# Patient Record
Sex: Female | Born: 1950
Health system: Southern US, Community
[De-identification: ages and names within clinical notes are randomized; demographics above are authoritative.]

## PROBLEM LIST (undated history)

## (undated) DIAGNOSIS — C801 Malignant (primary) neoplasm, unspecified: Secondary | ICD-10-CM

## (undated) DIAGNOSIS — E7800 Pure hypercholesterolemia, unspecified: Secondary | ICD-10-CM

## (undated) DIAGNOSIS — Z8619 Personal history of other infectious and parasitic diseases: Secondary | ICD-10-CM

## (undated) DIAGNOSIS — D649 Anemia, unspecified: Secondary | ICD-10-CM

## (undated) DIAGNOSIS — N809 Endometriosis, unspecified: Secondary | ICD-10-CM

## (undated) DIAGNOSIS — D219 Benign neoplasm of connective and other soft tissue, unspecified: Secondary | ICD-10-CM

## (undated) DIAGNOSIS — F329 Major depressive disorder, single episode, unspecified: Secondary | ICD-10-CM

## (undated) DIAGNOSIS — F32A Depression, unspecified: Secondary | ICD-10-CM

## (undated) DIAGNOSIS — T7840XA Allergy, unspecified, initial encounter: Secondary | ICD-10-CM

## (undated) HISTORY — PX: APPENDECTOMY: SHX54

## (undated) HISTORY — PX: BUNIONECTOMY: SHX129

## (undated) HISTORY — DX: Endometriosis, unspecified: N80.9

## (undated) HISTORY — DX: Malignant (primary) neoplasm, unspecified: C80.1

## (undated) HISTORY — PX: TUBAL LIGATION: SHX77

## (undated) HISTORY — DX: Personal history of other infectious and parasitic diseases: Z86.19

## (undated) HISTORY — DX: Major depressive disorder, single episode, unspecified: F32.9

## (undated) HISTORY — DX: Anemia, unspecified: D64.9

## (undated) HISTORY — PX: COSMETIC SURGERY: SHX468

## (undated) HISTORY — PX: COLONOSCOPY: SHX174

## (undated) HISTORY — PX: BREAST EXCISIONAL BIOPSY: SUR124

## (undated) HISTORY — DX: Benign neoplasm of connective and other soft tissue, unspecified: D21.9

## (undated) HISTORY — DX: Allergy, unspecified, initial encounter: T78.40XA

## (undated) HISTORY — PX: ABDOMINAL HYSTERECTOMY: SHX81

## (undated) HISTORY — DX: Depression, unspecified: F32.A

---

## 2004-04-10 HISTORY — PX: ATRIAL TACH ABLATION: EP1192

## 2015-04-11 HISTORY — PX: ROTATOR CUFF REPAIR: SHX139

## 2015-10-25 ENCOUNTER — Ambulatory Visit (INDEPENDENT_AMBULATORY_CARE_PROVIDER_SITE_OTHER): Payer: BLUE CROSS/BLUE SHIELD | Admitting: Podiatry

## 2015-10-25 ENCOUNTER — Ambulatory Visit (INDEPENDENT_AMBULATORY_CARE_PROVIDER_SITE_OTHER): Payer: BLUE CROSS/BLUE SHIELD

## 2015-10-25 ENCOUNTER — Encounter: Payer: Self-pay | Admitting: Podiatry

## 2015-10-25 VITALS — BP 107/56 | HR 70 | Resp 18

## 2015-10-25 DIAGNOSIS — B351 Tinea unguium: Secondary | ICD-10-CM

## 2015-10-25 DIAGNOSIS — M2011 Hallux valgus (acquired), right foot: Secondary | ICD-10-CM

## 2015-10-25 DIAGNOSIS — R52 Pain, unspecified: Secondary | ICD-10-CM

## 2015-10-25 DIAGNOSIS — M21621 Bunionette of right foot: Secondary | ICD-10-CM

## 2015-10-25 NOTE — Patient Instructions (Signed)

## 2015-10-25 NOTE — Progress Notes (Signed)
Subjective:    Patient ID: Sheila Hawkins, female    DOB: [birth year], 65 y.o.   MRN: [id number]  HPI  65 year old female presents the also concerns of her right foot bunion as well as tailor's bunion which is been ongoing for several years. She presents today had left foot bunion surgery in 2009 as well as a stress fracture of the second metatarsal she had fixed. She states that the right foot bunion Sellars bunion is painful with shoe gear and pressure. She has tried padding, offloading as well as changing her shoes but any relief of symptoms. At this time should discuss surgical intervention.She also states that both of her second toenails are thick and discolored but not causing any pain. Denies any redness or drainage.   Review of Systems  All other systems reviewed and are negative.      Objective:   Physical Exam General: AAO x3, NAD  Dermatological: Skin is warm, dry and supple bilateral. That'll second digit has are dystrophic, discolored hypertrophic without any tenderness or signs of infection. There are no open sores, no preulcerative lesions, no rash or signs of infection present.  Vascular: Dorsalis Pedis artery and Posterior Tibial artery pedal pulses are 2/4 bilateral with immedate capillary fill time. Pedal hair growth present. There is no pain with calf compression, swelling, warmth, erythema.   Neruologic: Grossly intact via light touch bilateral. Vibratory intact via tuning fork bilateral. Protective threshold with Semmes Wienstein monofilament intact to all pedal sites bilateral.   Musculoskeletal: HAV is present on the right side with erythema on the medial aspect of the first metatarsal head from shoe gear. There is no pain or crepitation with first MPJ range of motion. There is no hypermobility present. Tailors bunion is evident with again irritation on the lateral aspect of the fifth metatarsal head from irritation in shoes. No skin breakdown. There is no pain or  crepitation fifth MPJ range of motion. Evidence of previous surgery of the left bunion with a healed scar. No other areas of tenderness bilaterally. MMT 5/5. Range of motion intact.  Gait: Unassisted, Nonantalgic.       Assessment & Plan:  65 year old female synthetic right foot tailor's bunion, HAV -Treatment options discussed including all alternatives, risks, and complications -Etiology of symptoms were discussed -X-rays were obtained and reviewed with the patient. Tailors bunion HAV present. No evidence of acute fracture. -I discussed both conservative and surgical treatment options. This points to the tendon multiple conservative treatment options without much relief of symptoms. She is requesting surgical intervention at this time. I discussed with her also bunionectomy and tailor's bunionectomy with screw fixation. She wishes to proceed with surgical intervention. -The incision placement as well as the postoperative course was discussed with the patient. I discussed risks of the surgery which include, but not limited to, infection, bleeding, pain, swelling, need for further surgery, delayed or nonhealing, painful or ugly scar, numbness or sensation changes, over/under correction, recurrence, transfer lesions, further deformity, hardware failure, DVT/PE, loss of toe/foot. Patient understands these risks and wishes to proceed with surgery. The surgical consent was reviewed with the patient all 3 pages were signed. No promises or guarantees were given to the outcome of the procedure. All questions were answered to the best of my ability. Before the surgery the patient was encouraged to call the office if there is any further questions. The surgery will be performed at the [facility] on an outpatient basis. -Bilateral second digit nails were debrided and sent for  culture. This was sent to [facility].   Celesta Gentile, DPM

## 2015-11-04 ENCOUNTER — Telehealth: Payer: Self-pay | Admitting: *Deleted

## 2015-11-04 NOTE — Telephone Encounter (Signed)
Dr. Jacqualyn Posey reviewed fungal culture results of 10/25/2015 as +, request pt make an appt.  I informed pt of results and orders and transferred to schedulers.

## 2015-11-08 ENCOUNTER — Encounter: Payer: Self-pay | Admitting: Podiatry

## 2015-11-08 ENCOUNTER — Ambulatory Visit (INDEPENDENT_AMBULATORY_CARE_PROVIDER_SITE_OTHER): Payer: BLUE CROSS/BLUE SHIELD | Admitting: Podiatry

## 2015-11-08 DIAGNOSIS — B351 Tinea unguium: Secondary | ICD-10-CM

## 2015-11-08 MED ORDER — TERBINAFINE HCL 250 MG PO TABS: 250.0000 mg | ORAL_TABLET | Freq: Every day | ORAL | 0 refills | Status: DC

## 2015-11-08 NOTE — Progress Notes (Signed)
Subjective: Patient presents today for nail culture results. She denies any changes since last appointment and no new concerns today. She is scheduled for right foot surgery in September she has no further questions about this. She does continue to get pain over the bunion sites. Denies any systemic complaints such as fevers, chills, nausea, vomiting. No acute changes since last appointment, and no other complaints at this time.   Objective: AAO x3, NAD DP/PT pulses palpable bilaterally, CRT less than 3 seconds Nails are somewhat dystrophic, discolored hypertrophic particularly left second digit in the right third digit toenail. No pain of the tenderness andrednessordrainage. No edema, erythema, increase in warmth to bilateral lower extremities.  No open lesions or pre-ulcerative lesions.  No pain with calf compression, swelling, warmth, erythema  Assessment: Onychomycosis  Plan: -All treatment options discussed with the patient including all alternatives, risks, complications.  -Nail culture results were discussed with the patient. This time she has elected to proceed with oral Lamisil after discussing risks, complications and side effects the medication. She wishes to proceed. Ordered blood work today as well as the medicine. Do not start the medicine call the results the blood work and she understood this. Follow-up in 4-6 weeks. -Patient encouraged to call the office with any questions, concerns, change in symptoms.   Celesta Gentile, DPM

## 2015-11-08 NOTE — Patient Instructions (Signed)

## 2015-11-09 LAB — HEPATIC FUNCTION PANEL
ALT: 11 U/L (ref 6–29)
AST: 18 U/L (ref 10–35)
Albumin: 4.1 g/dL (ref 3.6–5.1)
Alkaline Phosphatase: 67 U/L (ref 33–130)
Bilirubin, Direct: 0.1 mg/dL (ref <=0.2)
Indirect Bilirubin: 0.3 mg/dL (ref 0.2–1.2)
Total Bilirubin: 0.4 mg/dL (ref 0.2–1.2)
Total Protein: 6 g/dL — ABNORMAL LOW (ref 6.1–8.1)

## 2015-12-10 ENCOUNTER — Ambulatory Visit (INDEPENDENT_AMBULATORY_CARE_PROVIDER_SITE_OTHER): Payer: BLUE CROSS/BLUE SHIELD | Admitting: Podiatry

## 2015-12-10 DIAGNOSIS — B351 Tinea unguium: Secondary | ICD-10-CM | POA: Diagnosis not present

## 2015-12-10 DIAGNOSIS — M21621 Bunionette of right foot: Secondary | ICD-10-CM | POA: Diagnosis not present

## 2015-12-10 DIAGNOSIS — M2011 Hallux valgus (acquired), right foot: Secondary | ICD-10-CM

## 2015-12-10 MED ORDER — TERBINAFINE HCL 250 MG PO TABS: 250.0000 mg | ORAL_TABLET | Freq: Every day | ORAL | 0 refills | Status: DC

## 2015-12-10 MED ORDER — CEPHALEXIN 500 MG PO CAPS: 500.0000 mg | ORAL_CAPSULE | Freq: Three times a day (TID) | ORAL | 1 refills | Status: DC

## 2015-12-10 MED ORDER — MEPERIDINE HCL 50 MG PO TABS: 50.0000 mg | ORAL_TABLET | ORAL | 0 refills | Status: DC | PRN

## 2015-12-10 MED ORDER — PROMETHAZINE HCL 25 MG PO TABS: 25.0000 mg | ORAL_TABLET | Freq: Three times a day (TID) | ORAL | 0 refills | Status: DC | PRN

## 2015-12-10 NOTE — Patient Instructions (Signed)
You can go ahead and start the lamisil Hold off on taking demerol, phenergan, keflex as these are your post-operative medications. You can start these after surgery  If you have any questions before surgery, please give me a call or I will see you Wednesday morning before surgery.

## 2015-12-15 ENCOUNTER — Encounter: Payer: Self-pay | Admitting: Podiatry

## 2015-12-15 DIAGNOSIS — M2011 Hallux valgus (acquired), right foot: Secondary | ICD-10-CM | POA: Diagnosis not present

## 2015-12-17 NOTE — Progress Notes (Signed)
DOS 09.06.2017 Right foot surgical repair of bunion and taylors bunion with screw fixation

## 2015-12-19 DIAGNOSIS — M2010 Hallux valgus (acquired), unspecified foot: Secondary | ICD-10-CM | POA: Insufficient documentation

## 2015-12-19 DIAGNOSIS — M21621 Bunionette of right foot: Secondary | ICD-10-CM | POA: Insufficient documentation

## 2015-12-19 NOTE — Progress Notes (Signed)
Subjective: Patient presents today to follow-up after starting lamisil however she states that she never got the prescription. [place] and did not want to start. She has no new concerns today. She is also scheduled for bunion/tailors bunion surgery next week and she states that she is ready and has no questions today.  Denies any systemic complaints such as fevers, chills, nausea, vomiting. No acute changes since last appointment, and no other complaints at this time.   Objective: AAO x3, NAD DP/PT pulses palpable bilaterally, CRT less than 3 seconds Nails are unchanged and are dystrophic, discolored and hypertrophic mostly the left 2nd and right 3rd digit. No pain to the nails and no drainage, redness or signs of infection.  Moderate HAV and tailors bunion which is painful. Overall unchanged.  No open lesions or pre-ulcerative lesions.  No pain with calf compression, swelling, warmth, erythema  Assessment: Onychomycosis, HAV/Tailors bunion  Plan: -All treatment options discussed with the patient including all alternatives, risks, complications.  -Lamsil prescribed and again discussed potential side effects of the medication. She understands -Again discussed the upcoming surgery. She has no further questions. Post-operative medications prescribed today.  -Follow-up after surgery -Patient encouraged to call the office with any questions, concerns, change in symptoms.   Celesta Gentile, DPM

## 2015-12-20 ENCOUNTER — Ambulatory Visit (INDEPENDENT_AMBULATORY_CARE_PROVIDER_SITE_OTHER): Payer: BLUE CROSS/BLUE SHIELD

## 2015-12-20 ENCOUNTER — Ambulatory Visit (INDEPENDENT_AMBULATORY_CARE_PROVIDER_SITE_OTHER): Payer: BLUE CROSS/BLUE SHIELD | Admitting: Podiatry

## 2015-12-20 ENCOUNTER — Encounter: Payer: Self-pay | Admitting: Podiatry

## 2015-12-20 DIAGNOSIS — Z09 Encounter for follow-up examination after completed treatment for conditions other than malignant neoplasm: Secondary | ICD-10-CM

## 2015-12-20 DIAGNOSIS — M21621 Bunionette of right foot: Secondary | ICD-10-CM

## 2015-12-20 DIAGNOSIS — M216X1 Other acquired deformities of right foot: Secondary | ICD-10-CM

## 2015-12-20 DIAGNOSIS — M2011 Hallux valgus (acquired), right foot: Secondary | ICD-10-CM

## 2015-12-21 NOTE — Progress Notes (Signed)
Subjective: Sheila Hawkins is a 65 y.o. is seen today in office s/p right foot Austin bunionectomy and tailors bunion preformed on 12/15/15 . They state their pain is minimal and she has not received any pain medication since Saturday. She has continued to wear the CAM boot but does not wear it at night. Denies any systemic complaints such as fevers, chills, nausea, vomiting. No calf pain, chest pain, shortness of breath.   Objective: General: No acute distress, AAOx3  DP/PT pulses palpable 2/4, CRT < 3 sec to all digits.  Protective sensation intact. Motor function intact.  Right foot: Incision is well coapted without any evidence of dehiscence. There is no surrounding erythema, ascending cellulitis, fluctuance, crepitus, malodor, drainage/purulence. There is mild edema around the surgical site. There is minimal pain along the surgical site.  No other areas of tenderness to bilateral lower extremities.  No other open lesions or pre-ulcerative lesions.  No pain with calf compression, swelling, warmth, erythema.   Assessment and Plan:  Status post right foot surgery, doing well with no complications   -Treatment options discussed including all alternatives, risks, and complications -X-rays were obtained and reviewed with the patient. Hardware intact. No evidence of acute fracture.  -Antibiotic ointment applied followed by a bandage. Keep bandage clean, dry, intact. -Continue cam boot at all times. -Ice/elevation -Pain medication as needed. -Finish course of antibiotics  -Monitor for any clinical signs or symptoms of infection and DVT/PE and directed to call the office immediately should any occur or go to the ER. -Follow-up in 1 week or sooner if any problems arise. In the meantime, encouraged to call the office with any questions, concerns, change in symptoms.   Celesta Gentile, DPM

## 2015-12-27 ENCOUNTER — Ambulatory Visit (INDEPENDENT_AMBULATORY_CARE_PROVIDER_SITE_OTHER): Payer: BLUE CROSS/BLUE SHIELD | Admitting: Podiatry

## 2015-12-27 DIAGNOSIS — M2011 Hallux valgus (acquired), right foot: Secondary | ICD-10-CM

## 2015-12-27 DIAGNOSIS — M21621 Bunionette of right foot: Secondary | ICD-10-CM

## 2015-12-27 DIAGNOSIS — Z09 Encounter for follow-up examination after completed treatment for conditions other than malignant neoplasm: Secondary | ICD-10-CM

## 2016-01-03 NOTE — Progress Notes (Signed)
Subjective: Sheila Hawkins is a 65 y.o. is seen today in office s/p right foot Austin bunionectomy and tailors bunion preformed on 12/15/15. She states that she is doing well she's not having any pain. She is continued with his CAM boot but she does not sleep in this at night. She has been trying to move her toes to increase the range of motion to her toes and she states that she is doing well. Denies any systemic complaints such as fevers, chills, nausea, vomiting. No calf pain, chest pain, shortness of breath.   Objective: General: No acute distress, AAOx3  DP/PT pulses palpable 2/4, CRT < 3 sec to all digits.  Protective sensation intact. Motor function intact.  Right foot: Incision is well coapted without any evidence of dehiscence and suture ends are intact. There is no surrounding erythema, ascending cellulitis, fluctuance, crepitus, malodor, drainage/purulence. There is decreased edema around the surgical site. There is no significant pain along the surgical site.  No other areas of tenderness to bilateral lower extremities.  No other open lesions or pre-ulcerative lesions.  No pain with calf compression, swelling, warmth, erythema.   Assessment and Plan:  Status post right foot surgery, doing well with no complications   -Treatment options discussed including all alternatives, risks, and complications -Suture ends were cut today. About, was applied followed by a bandage. She can start to shower tomorrow. Recommended an about ointment dressing changes daily. -Continue with CAM boot at all times -Gentle range of motion exercises for the first MPJ -Ice/elevation -Pain medication as needed. -Monitor for any clinical signs or symptoms of infection and DVT/PE and directed to call the office immediately should any occur or go to the ER. -Follow-up as scheduled or sooner if any problems arise. In the meantime, encouraged to call the office with any questions, concerns, change in symptoms.   Celesta Gentile, DPM

## 2016-01-10 ENCOUNTER — Ambulatory Visit (INDEPENDENT_AMBULATORY_CARE_PROVIDER_SITE_OTHER): Payer: BLUE CROSS/BLUE SHIELD | Admitting: Podiatry

## 2016-01-10 ENCOUNTER — Ambulatory Visit (INDEPENDENT_AMBULATORY_CARE_PROVIDER_SITE_OTHER): Payer: BLUE CROSS/BLUE SHIELD

## 2016-01-10 DIAGNOSIS — M21621 Bunionette of right foot: Secondary | ICD-10-CM

## 2016-01-10 DIAGNOSIS — Z09 Encounter for follow-up examination after completed treatment for conditions other than malignant neoplasm: Secondary | ICD-10-CM | POA: Diagnosis not present

## 2016-01-10 DIAGNOSIS — M2011 Hallux valgus (acquired), right foot: Secondary | ICD-10-CM

## 2016-01-14 ENCOUNTER — Encounter: Payer: BLUE CROSS/BLUE SHIELD | Admitting: Podiatry

## 2016-01-20 NOTE — Progress Notes (Signed)
Subjective: Sheila Hawkins is a 65 y.o. is seen today in office s/p right foot Austin bunionectomy and tailors bunion preformed on 12/15/15. She scan some occasional burning pain along the bunion site which is new. Overall her pain has improved. She is also concerned of the fifth toe asserted drift in warts and this may be due to the compression stocking to typing pressure to the area. Denies any pain to the area. Denies any systemic complaints such as fevers, chills, nausea, vomiting. No calf pain, chest pain, shortness of breath.   Objective: General: No acute distress, AAOx3  DP/PT pulses palpable 2/4, CRT < 3 sec to all digits.  Protective sensation intact. Motor function intact.  Right foot: Incisions are well coapted without any evidence of dehiscence and scar has formed There is no surrounding erythema, ascending cellulitis, fluctuance, crepitus, malodor, drainage/purulence. There is decreased edema around the surgical site. There is no significant pain along the surgical site. There is good range of motion of the MPJ. There is no restriction identified. No other areas of tenderness to bilateral lower extremities.  No other open lesions or pre-ulcerative lesions.  No pain with calf compression, swelling, warmth, erythema.   Assessment and Plan:  Status post right foot surgery, doing well with no complications   -Treatment options discussed including all alternatives, risks, and complications -X-rays were reviewed. Hardware intact. Increased consolidation across the osteotomy sites. -She decided to transition to a surgical shoe. Continue weightbearing as tolerated. Continue range of motion exercises. -Hold off on the compression sock is is maybe, the toe to drift as there does appear to be a slight increase and adductovarus the fifth toe. Toe separator was also dispensed today. -Continue ice and elevation -Pain medication as needed. -Monitor for signs or symptoms of infection to the ER should  any occur call the office. -Follow as scheduled or sooner if needed.  Celesta Gentile, DPM

## 2016-01-24 ENCOUNTER — Ambulatory Visit (INDEPENDENT_AMBULATORY_CARE_PROVIDER_SITE_OTHER): Payer: BLUE CROSS/BLUE SHIELD

## 2016-01-24 ENCOUNTER — Ambulatory Visit (INDEPENDENT_AMBULATORY_CARE_PROVIDER_SITE_OTHER): Payer: BLUE CROSS/BLUE SHIELD | Admitting: Podiatry

## 2016-01-24 ENCOUNTER — Encounter: Payer: Self-pay | Admitting: Podiatry

## 2016-01-24 VITALS — BP 113/64 | HR 81 | Resp 16

## 2016-01-24 DIAGNOSIS — M2011 Hallux valgus (acquired), right foot: Secondary | ICD-10-CM

## 2016-01-24 DIAGNOSIS — Z9889 Other specified postprocedural states: Secondary | ICD-10-CM

## 2016-01-24 DIAGNOSIS — Z79899 Other long term (current) drug therapy: Secondary | ICD-10-CM

## 2016-01-24 MED ORDER — NONFORMULARY OR COMPOUNDED ITEM: 2 refills | Status: DC

## 2016-01-24 NOTE — Progress Notes (Signed)
Subjective: Sheila Hawkins is a 65 y.o. is seen today in office s/p right foot Austin bunionectomy and tailors bunion preformed on 12/15/15. She states that she has good range of motion but she has some nerve pain to there surgical site. He points along the incision and she states that this is very sensitive to the touch. She's been using essential oils over the area when it becomes very sensitive. She is ready to get back to walking and wear a regular shoe. She has been using Lamisil daily. Denies any side effects. Denies any systemic complaints such as fevers, chills, nausea, vomiting. No calf pain, chest pain, shortness of breath.   Objective: General: No acute distress, AAOx3  DP/PT pulses palpable 2/4, CRT < 3 sec to all digits.  Protective sensation intact. Motor function intact.  Right foot: Incisions are well coapted without any evidence of dehiscence and scar has formed There is no surrounding erythema, ascending cellulitis, fluctuance, crepitus, malodor, drainage/purulence. There is decreased edema around the surgical site. There is no significant pain along the surgical site. There is good range of motion of the MPJ. There is no restriction with range of motion. Subjectively there is decreased sensation along the medial aspect on the bunion site and there is sensitivity on the incision. No other areas of tenderness to bilateral lower extremities.  No other open lesions or pre-ulcerative lesions. Nails are fairly growing out along the proximal nail border. there is no tenderness the toenails.  No pain with calf compression, swelling, warmth, erythema.   Assessment and Plan:  Status post right foot surgery, with neuritis type symptoms; onychomycosis  -Treatment options discussed including all alternatives, risks, and complications -X-rays were reviewed. Hardware intact. Increased consolidation across the osteotomy sites. -Order compound cream to help with nerve pain. If symptoms continue  discussed gabapentin. -Continue range of motion exercises -Can transition to regular shoe as tolerated and gradually increase her activity. -Ice elevation -Continue Lamisil. She's having no side effects. Repeat blood work today. Finish 3 month course of Lamisil. -Follow-up in 4 weeks or sooner if needed. In the meantime call any questions or concerns or any changes symptoms.  Celesta Gentile, DPM  *scan for orthotics next appointment

## 2016-01-25 LAB — CBC WITH DIFFERENTIAL/PLATELET
Basophils Absolute: 84 cells/uL (ref 0–200)
Basophils Relative: 1 %
Eosinophils Absolute: 84 cells/uL (ref 15–500)
Eosinophils Relative: 1 %
HCT: 40.9 % (ref 35.0–45.0)
Hemoglobin: 13.2 g/dL (ref 11.7–15.5)
Lymphocytes Relative: 22 %
Lymphs Abs: 1848 cells/uL (ref 850–3900)
MCH: 29.7 pg (ref 27.0–33.0)
MCHC: 32.3 g/dL (ref 32.0–36.0)
MCV: 91.9 fL (ref 80.0–100.0)
MPV: 9.7 fL (ref 7.5–12.5)
Monocytes Absolute: 588 cells/uL (ref 200–950)
Monocytes Relative: 7 %
Neutro Abs: 5796 cells/uL (ref 1500–7800)
Neutrophils Relative %: 69 %
Platelets: 272 10*3/uL (ref 140–400)
RBC: 4.45 MIL/uL (ref 3.80–5.10)
RDW: 13.8 % (ref 11.0–15.0)
WBC: 8.4 10*3/uL (ref 3.8–10.8)

## 2016-01-26 ENCOUNTER — Telehealth: Payer: Self-pay | Admitting: *Deleted

## 2016-01-26 LAB — HEPATIC FUNCTION PANEL
ALT: 8 U/L (ref 6–29)
AST: 15 U/L (ref 10–35)
Albumin: 3.9 g/dL (ref 3.6–5.1)
Alkaline Phosphatase: 67 U/L (ref 33–130)
Bilirubin, Direct: 0.1 mg/dL (ref <=0.2)
Indirect Bilirubin: 0.2 mg/dL (ref 0.2–1.2)
Total Bilirubin: 0.3 mg/dL (ref 0.2–1.2)
Total Protein: 6.3 g/dL (ref 6.1–8.1)

## 2016-01-26 NOTE — Telephone Encounter (Addendum)
Message from Sheila Hawkins, DPM sent at 01/26/2016 10:09 AM EDT ----- Blood work normal- continue lamisil. Please let her know. Thank you. Informed pt of Dr. Leigh Aurora orders.

## 2016-02-21 ENCOUNTER — Ambulatory Visit (INDEPENDENT_AMBULATORY_CARE_PROVIDER_SITE_OTHER): Payer: BLUE CROSS/BLUE SHIELD | Admitting: Podiatry

## 2016-02-21 ENCOUNTER — Ambulatory Visit (INDEPENDENT_AMBULATORY_CARE_PROVIDER_SITE_OTHER): Payer: BLUE CROSS/BLUE SHIELD

## 2016-02-21 DIAGNOSIS — Z9889 Other specified postprocedural states: Secondary | ICD-10-CM

## 2016-02-21 DIAGNOSIS — Z09 Encounter for follow-up examination after completed treatment for conditions other than malignant neoplasm: Secondary | ICD-10-CM

## 2016-02-21 DIAGNOSIS — M21621 Bunionette of right foot: Secondary | ICD-10-CM | POA: Diagnosis not present

## 2016-02-21 DIAGNOSIS — M722 Plantar fascial fibromatosis: Secondary | ICD-10-CM

## 2016-02-21 DIAGNOSIS — M79673 Pain in unspecified foot: Secondary | ICD-10-CM

## 2016-02-21 DIAGNOSIS — M2011 Hallux valgus (acquired), right foot: Secondary | ICD-10-CM

## 2016-02-21 MED ORDER — GABAPENTIN 100 MG PO CAPS: 100.0000 mg | ORAL_CAPSULE | Freq: Every day | ORAL | 3 refills | Status: DC

## 2016-02-25 NOTE — Progress Notes (Signed)
Subjective: Sheila Hawkins is a 65 y.o. is seen today in office s/p right foot Austin bunionectomy and tailors bunion preformed on 12/15/15. She states that she is up to walking 2 miles. She states that she doesn't have significant pain to the surgical site starting the day however at night she still gets sharp pain and she is concerned about the nerve   damage. She's been using the compound cream without any significant relief and she is also using the essential oils. She states that she has very good range of motion to her big toe joint when she has no problems during the day but again denies where she is majority of symptoms. She is also been rowing without any significant problems.  She is still taking the Lamisil for onychomycosis and she is almost completed his course. She states the nails have grown out. Denies any pain of the toenails or any redness or drainage.  Denies any systemic complaints such as fevers, chills, nausea, vomiting. No calf pain, chest pain, shortness of breath.   Objective: General: No acute distress, AAOx3  DP/PT pulses palpable 2/4, CRT < 3 sec to all digits.  Protective sensation intact. Motor function intact.  Right foot: Incisions are well coapted without any evidence of dehiscence and scar has formed. There is very good range of motion the first metatarsal phalangeal joint there is no tenderness to palpation on the surgical sites. Hallux sits in a rectus position. There is minimal edema to the area without any surrounding erythema or increase in warmth. She points on the medial aspect on the bunion site where she still gets some nerve pain. She gets tenderness on the medial aspect of the midfoot bilaterally this is been ongoing since prior to the surgery. She has an old pair of orthotics but she feels that she is a new pair. She also gets some subjective tenderness along the medial band plantar fascia the arch of the foot however she is otherwise. The pain today. No other  areas of tenderness bilaterally. Nails appear to be improving there is clearing on the proximal nail border. There is no tenderness there is no surrounding redness or drainage. No pain with calf compression, swelling, warmth, erythema.   Assessment and Plan:  Status post right foot surgery, with neuritis type symptoms; onychomycosis  -Treatment options discussed including all alternatives, risks, and complications -X-rays were reviewed. Increased consolidation across the osteotomy sites. hardware intact. No evidence of acute fracture. -At this point the nerve pain will start gabapentin. Start 100 mg at nighttime if she can tolerate the medicine. She can still use the compound cream as well. -Consider steroid injection to the area next appointment  -Continue supportive shoegear. Also discussed orthotics and she would like to proceed with this as well. She was scanned for orthotics may be sent to [facility] labs. -Follow-up in 3 weeks or sooner if needed. Call any questions or concerns in the meantime.  Celesta Gentile, DPM

## 2016-03-13 ENCOUNTER — Ambulatory Visit: Payer: BLUE CROSS/BLUE SHIELD | Admitting: Podiatry

## 2016-03-24 ENCOUNTER — Ambulatory Visit (INDEPENDENT_AMBULATORY_CARE_PROVIDER_SITE_OTHER): Payer: BLUE CROSS/BLUE SHIELD | Admitting: Podiatry

## 2016-03-24 ENCOUNTER — Encounter: Payer: Self-pay | Admitting: Podiatry

## 2016-03-24 DIAGNOSIS — S90222A Contusion of left lesser toe(s) with damage to nail, initial encounter: Secondary | ICD-10-CM

## 2016-03-24 DIAGNOSIS — B351 Tinea unguium: Secondary | ICD-10-CM | POA: Diagnosis not present

## 2016-03-24 DIAGNOSIS — M792 Neuralgia and neuritis, unspecified: Secondary | ICD-10-CM

## 2016-03-24 DIAGNOSIS — M2011 Hallux valgus (acquired), right foot: Secondary | ICD-10-CM

## 2016-03-24 DIAGNOSIS — M21621 Bunionette of right foot: Secondary | ICD-10-CM

## 2016-03-24 NOTE — Patient Instructions (Signed)

## 2016-03-27 MED ORDER — NONFORMULARY OR COMPOUNDED ITEM: 2 refills | Status: DC

## 2016-03-27 NOTE — Progress Notes (Signed)
Subjective: 65 year old female presents the office they for follow-up evaluation of the right is status post bunionectomy. She also presents a pickup orthotics. She states that she still gets some sharp intermittent discomfort at times. It is somewhat improved but does continue. She is taking gabapentin 100 mg at night time which is helping submit she stopped it within the pain started to come back. She has been able to walk and do activities without any significant pain. She has also noticed a black spot forming her left fourth toenail. She trimmed this about 2 weeks ago but a black spot out. She is completed her course of Lamisil. Denies any pain or swelling or any drainage. Denies any systemic complaints such as fevers, chills, nausea, vomiting. No acute changes since last appointment, and no other complaints at this time.   Objective: AAO x3, NAD DP/PT pulses palpable bilaterally, CRT less than 3 seconds Scars from prior surgery well healed. There is good range of motion the first MTPJ. There continues been neuritis symptoms along the medial aspect of the foot along the bunion site. There is no specific area pinpoint bony tenderness pain vibratory sensations today. Mild discomfort on the medial band plantar fasciitis the foot. Nails can be somewhat dystrophic and discolored. There is no pain to the toenails there is no surrounding redness or drainage. Left fourth toenail the very distal is a very small area of black discoloration. Upon debridement as area was removed and appeared to be a small amount of dried blood. There is no hyperpigmentation into the skin or to the other areas of the nail. No open lesions or pre-ulcerative lesions.  No pain with calf compression, swelling, warmth, erythema  Assessment: Neuritis status post when he has been right foot, onychomycosis, subungual hematoma  Plan: -All treatment options discussed with the patient including all alternatives, risks, complications.   -Given her continued gabapentin. Also discussed topical creams for this as well. Offloading pads. -Order compound cream for onychomycosis. She completed her course of Lamisil. There is some improvement in the nails but they still remain discolored and thickened mostly towards the distal portion the nail. -I debrided the left fourth digit toenails remove a small subdural hematoma. There is no remaining hyperpigmentation. Continue to monitor. -Orthotics are dispensed today. Oral and written break in instructions were discussed. -Follow up in 4 weeks or sooner if needed. -Patient encouraged to call the office with any questions, concerns, change in symptoms.   Celesta Gentile, DPM

## 2016-04-21 ENCOUNTER — Ambulatory Visit: Payer: BLUE CROSS/BLUE SHIELD

## 2016-04-21 ENCOUNTER — Ambulatory Visit (INDEPENDENT_AMBULATORY_CARE_PROVIDER_SITE_OTHER): Payer: BLUE CROSS/BLUE SHIELD | Admitting: Podiatry

## 2016-04-21 ENCOUNTER — Encounter: Payer: Self-pay | Admitting: Podiatry

## 2016-04-21 DIAGNOSIS — R52 Pain, unspecified: Secondary | ICD-10-CM

## 2016-04-21 DIAGNOSIS — M21611 Bunion of right foot: Secondary | ICD-10-CM

## 2016-04-21 DIAGNOSIS — M792 Neuralgia and neuritis, unspecified: Secondary | ICD-10-CM

## 2016-04-25 NOTE — Progress Notes (Signed)
Subjective: 66 year old female presents the office they for follow-up evaluation of the right is status post bunionectomy. She states that the motion to her toe is good and she is happy with the position. Her main concern is that she is still experiencing a nerve pain and she points to over the incision. She states this is intermittent and is worse at night. She is up to walking 2 miles in a regular shoe. Denies any swelling or redness.  Denies any systemic complaints such as fevers, chills, nausea, vomiting. No acute changes since last appointment, and no other complaints at this time.   Objective: AAO x3, NAD DP/PT pulses palpable bilaterally, CRT less than 3 seconds Scars from prior surgery well healed. There is excellent range of motion the first MTPJ. She is experiencing neuritis symptoms over the incision about from the midpoint of the incision distally. She states it feels like a superficial pain and not deep. There is a negative tinel sign. There does appear to be an area of somewhat thickened scar tissue over the distal portion of the incision. There is no area of pinpoint tenderness. There is no overlying edema, erythema, increase in warmth bilaterally. No open lesions or pre-ulcerative lesions.  No pain with calf compression, swelling, warmth, erythema  Assessment: Neuritis right foot status post bunionectomy possibly due to entrapment of scar tissue  Plan: -All treatment options discussed with the patient including all alternatives, risks, complications.  -X-rays were obtained and reviewed with the patient. Hardware intact.  -I discussed a steroid injection underneath the incision as this neuritis appears to be superficial. This apparently started later from the surgery. Today an injection with a mixture of Kenalog as well as local anesthetic was infiltrated subdermally along the scar. Post injection care was discussed. -Continue with topical anti-inflammatory. She's been using topical  Voltaren which has been helping quite a bit when she has discomfort. -Offloading pads were dispensed -Follow-up scheduled or sooner if needed.  Celesta Gentile, DPM

## 2016-05-19 ENCOUNTER — Encounter: Payer: Self-pay | Admitting: Podiatry

## 2016-05-19 ENCOUNTER — Ambulatory Visit (INDEPENDENT_AMBULATORY_CARE_PROVIDER_SITE_OTHER): Payer: BLUE CROSS/BLUE SHIELD | Admitting: Podiatry

## 2016-05-19 DIAGNOSIS — L905 Scar conditions and fibrosis of skin: Secondary | ICD-10-CM | POA: Diagnosis not present

## 2016-05-19 DIAGNOSIS — M792 Neuralgia and neuritis, unspecified: Secondary | ICD-10-CM

## 2016-05-21 DIAGNOSIS — L905 Scar conditions and fibrosis of skin: Secondary | ICD-10-CM

## 2016-05-21 MED ORDER — DEXAMETHASONE SODIUM PHOSPHATE 120 MG/30ML IJ SOLN
4.0000 mg | Freq: Once | INTRAMUSCULAR | Status: AC
  Administered 2016-05-21: 4 mg via INTRA_ARTICULAR

## 2016-05-21 NOTE — Progress Notes (Signed)
Subjective: 66 year old female presents the office they for follow-up evaluation of the right is status post bunionectomy. After her surgery she started to have neuritis symptoms. She states that the foot feels almost "100 percent better after the last appointment". She responded well to the injection. She is walking and doing the elliptical. She is wearing a regular shoe. Denies any swelling or redness.  Denies any systemic complaints such as fevers, chills, nausea, vomiting. No acute changes since last appointment, and no other complaints at this time.   Objective: AAO x3, NAD DP/PT pulses palpable bilaterally, CRT less than 3 seconds Scars from prior surgery well healed. There is excellent range of motion the first MTPJ. The scar appears to actually be much improved compared to last appointment. She is still getting some intermittent discomfort but overall she feels that she is much improved. She feels that there "is not enough padding" under the ball of the foot and that is where she gets the most of her pain at times. She is still experiencing some mild neuritis symptoms but again much improved. No open lesions or pre-ulcerative lesions.  There is no dark discoloration to her nails.  No pain with calf compression, swelling, warmth, erythema  Assessment: Neuritis right foot status post bunionectomy possibly due to entrapment of scar tissue which is much improved.   Plan: -All treatment options discussed with the patient including all alternatives, risks, complications.  -I discussed a repear steroid injection underneath the incision. The incision looks much better today.  Today an injection with a mixture of dexamethasone phosphate as well as local anesthetic was infiltrated subdermally along the scar. Post injection care was discussed. -I dispensed offloading pads for the metatarsals. Continue with supportive shoes and orthotics.  -At this time her symptoms are much improved. I will see her  back as needed. Encouraged her to call with any questions or concerns.   Celesta Gentile, DPM

## 2016-06-07 ENCOUNTER — Other Ambulatory Visit: Payer: Self-pay | Admitting: Internal Medicine

## 2016-06-07 DIAGNOSIS — Z1231 Encounter for screening mammogram for malignant neoplasm of breast: Secondary | ICD-10-CM

## 2016-06-28 ENCOUNTER — Encounter: Payer: Self-pay | Admitting: Radiology

## 2016-06-28 ENCOUNTER — Ambulatory Visit
Admission: RE | Admit: 2016-06-28 | Discharge: 2016-06-28 | Disposition: A | Discharge status: Home / Self Care | Payer: BLUE CROSS/BLUE SHIELD | Source: Ambulatory Visit | Attending: Internal Medicine | Admitting: Internal Medicine

## 2016-06-28 DIAGNOSIS — Z1231 Encounter for screening mammogram for malignant neoplasm of breast: Secondary | ICD-10-CM

## 2016-06-29 ENCOUNTER — Other Ambulatory Visit: Payer: Self-pay | Admitting: Internal Medicine

## 2016-06-29 DIAGNOSIS — R928 Other abnormal and inconclusive findings on diagnostic imaging of breast: Secondary | ICD-10-CM

## 2016-07-03 ENCOUNTER — Ambulatory Visit
Admission: RE | Admit: 2016-07-03 | Discharge: 2016-07-03 | Disposition: A | Discharge status: Home / Self Care | Payer: BLUE CROSS/BLUE SHIELD | Source: Ambulatory Visit | Attending: Internal Medicine | Admitting: Internal Medicine

## 2016-07-03 DIAGNOSIS — R928 Other abnormal and inconclusive findings on diagnostic imaging of breast: Secondary | ICD-10-CM

## 2016-09-01 ENCOUNTER — Ambulatory Visit (INDEPENDENT_AMBULATORY_CARE_PROVIDER_SITE_OTHER): Payer: BLUE CROSS/BLUE SHIELD | Admitting: Podiatry

## 2016-09-01 DIAGNOSIS — M792 Neuralgia and neuritis, unspecified: Secondary | ICD-10-CM | POA: Diagnosis not present

## 2016-09-01 DIAGNOSIS — B351 Tinea unguium: Secondary | ICD-10-CM

## 2016-09-05 NOTE — Progress Notes (Signed)
Subjective: 66 year old female presents the office today requesting laser therapy for her toenail fungus. She has previous he been using topical antifungal just proceed on Lamisil which did well but she has noticed the opposite starting to come back she does not want to do another round of Lamisil. She denies any pain to the toenails. She states that she is starting over the last week or so to note some mild reoccurrence of the neuritis symptoms from her bunion surgery. She denies any recent injury or trauma. She is still been exercising doing daily activities without any issues. Denies any systemic complaints such as fevers, chills, nausea, vomiting. No acute changes since last appointment, and no other complaints at this time.   Objective: AAO x3, NAD DP/PT pulses palpable bilaterally, CRT less than 3 seconds Nails are mildly hypertrophic, dystrophic, discolored yellow to brown discoloration. There is no tenderness the tenosynovitis or any redness or drainage. There is no significant tenderness to palpation along the surgical site and the right bunion. There is subjective sharp pains on the incision site which is going to recur. No erythema or increase in warmth and there is no edema. No pain with MTPJ ROM. The scar is very faint except along the distal portion which is minimally hypertrophic.  No open lesions or pre-ulcerative lesions.  No pain with calf compression, swelling, warmth, erythema  Assessment: Onychomycosis; neuritis  Plan: -All treatment options discussed with the patient including all alternatives, risks, complications.  -She wished to proceed with another steroid injection on the incision site. Under sterile conditions a mixture Kenalog localized that was infiltrated around the area of maximal tenderness without complications. Post injection care was discussed. -Laserering of Toenails was carried out at today's visit via Q-switch YAG laser by [employer] at continuous on  thetoenails.  Patient and staff were wearing appropriate laser protective goggles/eyewear Laser device was tested prior to use and safety protocols were followed Frequency of 5 Hz, Level 4, Joules 1.3 delivered.  The patient tolerated the lasering well without any complications. They were encouraged to call the office with any questions, concerns, change in symptoms. -Follow-up in 1 month or sooner if needed.  -Patient encouraged to call the office with any questions, concerns, change in symptoms.

## 2016-09-29 ENCOUNTER — Ambulatory Visit (INDEPENDENT_AMBULATORY_CARE_PROVIDER_SITE_OTHER): Payer: BLUE CROSS/BLUE SHIELD | Admitting: Podiatry

## 2016-09-29 DIAGNOSIS — L905 Scar conditions and fibrosis of skin: Secondary | ICD-10-CM

## 2016-09-29 DIAGNOSIS — M792 Neuralgia and neuritis, unspecified: Secondary | ICD-10-CM | POA: Diagnosis not present

## 2016-09-29 DIAGNOSIS — B351 Tinea unguium: Secondary | ICD-10-CM

## 2016-10-01 NOTE — Progress Notes (Signed)
Subjective: Ms. Casares presents the office today for another laser therapy for her toenail fungus. She believes the nails are doing better. No pain, swelling, redness, drainage from the nails. In regards to the pain in her right foot. She states that the steroid injections are helping and she has noticed some mild reoccurrence of sharp pain to one area along the incision. She has been exercising and doing her daily activities as she was prior to the surgery. She just notices at night she gets some occasional sharp pain. No recent injury or trauma.  Denies any systemic complaints such as fevers, chills, nausea, vomiting. No acute changes since last appointment, and no other complaints at this time.   Objective: AAO x3, NAD DP/PT pulses palpable bilaterally, CRT less than 3 seconds Nails are mildly hypertrophic, dystrophic, discolored yellow to brown discoloration. There is no tenderness  or any redness or drainage. There is no significant tenderness to palpation along the surgical site and the right bunion. There is subjective sharp pains on the incision site which is mild. The actual scar is much improved. No erythema or increase in warmth and there is no edema. No pain with MTPJ ROM. The scar is very faint except along the distal portion which is minimally hypertrophic.  No open lesions or pre-ulcerative lesions.  No pain with calf compression, swelling, warmth, erythema  Assessment: Onychomycosis; neuritis  Plan: -All treatment options discussed with the patient including all alternatives, risks, complications.  -She wished to proceed with another steroid injection on the incision site. Under sterile conditions a mixture Kenalog localized that was infiltrated around the area of maximal tenderness without complications directly under the scar. Post injection care was discussed. -Laserering of Toenails was carried out at today's visit via Q-switch YAG laser by [employer] at continuous on thetoenails.   Patient and staff were wearing appropriate laser protective goggles/eyewear Laser device was tested prior to use and safety protocols were followed Frequency of 5 Hz, Level 4, Joules 1.3 delivered.  The patient tolerated the lasering well without any complications. They were encouraged to call the office with any questions, concerns, change in symptoms. -Follow-up in 1 month or sooner if needed.  -Patient encouraged to call the office with any questions, concerns, change in symptoms.

## 2016-10-25 ENCOUNTER — Ambulatory Visit (HOSPITAL_COMMUNITY)
Admission: EM | Admit: 2016-10-25 | Discharge: 2016-10-25 | Disposition: A | Discharge status: Home / Self Care | Payer: BLUE CROSS/BLUE SHIELD | Attending: Internal Medicine | Admitting: Internal Medicine

## 2016-10-25 ENCOUNTER — Encounter (HOSPITAL_COMMUNITY): Payer: Self-pay | Admitting: Emergency Medicine

## 2016-10-25 DIAGNOSIS — S0083XA Contusion of other part of head, initial encounter: Secondary | ICD-10-CM

## 2016-10-25 DIAGNOSIS — R0781 Pleurodynia: Secondary | ICD-10-CM | POA: Diagnosis not present

## 2016-10-25 DIAGNOSIS — W19XXXA Unspecified fall, initial encounter: Secondary | ICD-10-CM | POA: Diagnosis not present

## 2016-10-25 HISTORY — DX: Pure hypercholesterolemia, unspecified: E78.00

## 2016-10-25 NOTE — ED Triage Notes (Addendum)
The patient presented to the [facility] with a complaint of face pain and right shoulder pain an right side rib pain secondary to a fall that occurred yesterday.

## 2016-10-25 NOTE — ED Provider Notes (Signed)
CSN: [id number]     Arrival date & time 10/25/16  1357 History   None    Chief Complaint  Patient presents with  . Fall   (Consider location/radiation/quality/duration/timing/severity/associated sxs/prior Treatment) 66 year old female comes in with right shoulder and rib pain after falling yesterday. She was picking up her mail, and as she was turning around, she fell down. Denies any weakness, syncope, lightheadedness, dizziness that caused the fall. She had a headache after the event, but has since resolved. Denies N/V. Denies photophobia, phonophobia. She has a bruise around her right eye where she fell. She has been taking some ibuprofen with some relief. Pain mostly focused on her right ribs. Denies chest pain, shortness of breath, trouble breathing, wheezing. Denies numbness/tingling. Denies confusion, asymmetry to the face, unilateral weakness.       Past Medical History:  Diagnosis Date  . Hypercholesterolemia    Past Surgical History:  Procedure Laterality Date  . BUNIONECTOMY    . ROTATOR CUFF REPAIR Right 2017   History reviewed. No pertinent family history. Social History  Substance Use Topics  . Smoking status: Never Smoker  . Smokeless tobacco: Never Used  . Alcohol use No   OB History    No data available     Review of Systems  Reason unable to perform ROS: HPI as above.    Allergies  Adhesive [tape] and Codeine  Home Medications   Prior to Admission medications   Medication Sig Start Date End Date Taking? Authorizing Provider  predniSONE (DELTASONE) 5 MG tablet Take 5 mg by mouth daily with breakfast.   Yes [provider]  citalopram (CELEXA) 20 MG tablet  02/23/16   [provider]  colchicine (COLCRYS) 0.6 MG tablet Colcrys 0.6 mg tablet    [provider]  fluticasone (FLONASE) 50 MCG/ACT nasal spray  08/22/16   [provider]  NONFORMULARY OR COMPOUNDED [place]:  Peripheral Neuropathy cream -  Bupivacaine 1%, Doxepin 3%, Gabapentin 6%, Pentoxifylline 3%, Topiramate 1%, apply 1-2 grams to affected area 3-4 times daily. 01/24/16   Trula Slade, DPM  NONFORMULARY OR COMPOUNDED [place]:  Onychomycosis Nail Lacquer - Fluconazole 2%, Terbinafine 1%, DMSO apply daily to affected area. 03/27/16   Trula Slade, DPM  omeprazole (PRILOSEC) 40 MG capsule  02/23/16   [provider]  rosuvastatin (CRESTOR) 10 MG tablet  02/23/16   [provider]  terbinafine (LAMISIL) 250 MG tablet Take 1 tablet (250 mg total) by mouth daily. 12/10/15   Trula Slade, DPM  Vitamin D, Ergocalciferol, (DRISDOL) 50000 units CAPS capsule  02/23/16   [provider]  zolpidem (AMBIEN) 10 MG tablet zolpidem 10 mg tablet    [provider]   Meds Ordered and Administered this Visit  Medications - No data to display  BP (!) 112/57 (BP Location: Left Arm)   Pulse 80   Temp 98.4 F (36.9 C) (Oral)   Resp 18   SpO2 96%  No data found.   Physical Exam  Constitutional: She is oriented to person, place, and time. She appears well-developed and well-nourished. No distress.  HENT:  Head: Normocephalic and atraumatic.  Eyes: Pupils are equal, round, and reactive to light. Conjunctivae and EOM are normal.  Contusion around right eye, with mild swelling. No pain with EOM.   Neck: Normal range of motion. Neck supple.  Cardiovascular: Normal rate, regular rhythm and normal heart sounds.  Exam reveals no gallop and no friction  rub.   No murmur heard. Pulmonary/Chest: Effort normal and breath sounds normal. No respiratory distress. She has no decreased breath sounds. She has no wheezes. She has no rales.  No contusions seen on the right side of chest. Tenderness to palpation around the 6th-7th rib. No failing seen during deep breaths.   Musculoskeletal:  Contusion of the right anterior shoulder. Tenderness to palpation at that region. Full ROM. Strength normal  and equal bilaterally. Sensation intact.   Neurological: She is alert and oriented to person, place, and time. She has normal strength. No cranial nerve deficit or sensory deficit. She displays a negative Romberg sign.  Skin: Skin is warm and dry.  Psychiatric: She has a normal mood and affect. Her behavior is normal. Judgment normal.    Urgent Care Course     Procedures (including critical care time)  Labs Review Labs Reviewed - No data to display  Imaging Review No results found.        MDM   1. Fall, initial encounter   2. Contusion of face, initial encounter   3. Rib pain on right side    Discussed with patient history and exam with low suspicion of fractures. Patient has meloxicam prescription at home, to start as directed for 10 days. She is currently on prednisone for her neck pain, and can continue to finish the course. Warm compresses for the eye. Ice/heat for shoulder/ribs. Activity as tolerated. Given normal lung exam without decrease in lung sounds, no flailing of the ribs with deep breaths, low suspicion for fractures causing pneumothorax. Given patient with normal neurology exam, low suspicion for intracranial bleed from injury. Patient to monitor for shortness of breath, chest pain, trouble breathing, headache, nausea, vomiting to go to the ED for further evaluation.    Ok Edwards, PA-C 10/25/16 1928

## 2016-10-25 NOTE — Discharge Instructions (Signed)
No evidence of broken bones today. Take meloxicam as directed for the next 10 days. You can finish your course of prednisone to help with the inflammation. Warm compresses for your eye. Ice/heat for your shoulder/ribs. Activity as tolerated. Monitor for shortness of breath, chest pain, trouble breathing, headache, nausea, vomiting to go to the emergency room for further evaluation. This can take up to 2-3 weeks to completely resolve, but you should be feeling better each week. Follow up with your PCP for further evaluation if symptoms are worsening.

## 2016-10-27 ENCOUNTER — Ambulatory Visit: Payer: BLUE CROSS/BLUE SHIELD | Admitting: Podiatry

## 2016-11-24 ENCOUNTER — Ambulatory Visit (INDEPENDENT_AMBULATORY_CARE_PROVIDER_SITE_OTHER): Payer: BLUE CROSS/BLUE SHIELD | Admitting: Podiatry

## 2016-11-24 ENCOUNTER — Encounter: Payer: Self-pay | Admitting: Podiatry

## 2016-11-24 DIAGNOSIS — M792 Neuralgia and neuritis, unspecified: Secondary | ICD-10-CM

## 2016-11-24 DIAGNOSIS — L905 Scar conditions and fibrosis of skin: Secondary | ICD-10-CM | POA: Diagnosis not present

## 2016-11-24 DIAGNOSIS — B351 Tinea unguium: Secondary | ICD-10-CM

## 2016-11-27 ENCOUNTER — Other Ambulatory Visit: Payer: Self-pay | Admitting: Internal Medicine

## 2016-11-27 DIAGNOSIS — M5412 Radiculopathy, cervical region: Secondary | ICD-10-CM

## 2016-11-27 DIAGNOSIS — M542 Cervicalgia: Secondary | ICD-10-CM

## 2016-11-27 DIAGNOSIS — R42 Dizziness and giddiness: Secondary | ICD-10-CM

## 2016-11-27 NOTE — Progress Notes (Signed)
Subjective: Ms. Dimaggio presents the office today for another laser therapy for her toenail fungus. She does with the nails are getting better. We had to miss last months treatment as there was a [place] problems. She presents today for lesion of the nails. She also states that she still gets some nerve symptoms on the bunion site of the right side. She states that the injection she had resolution of pain for about 5 weeks it seems that she is not sure. Over the last 2 weeks she has started to notice reoccurrence. She says the pain is worse at nighttime. She is able to do her daily activities including rowing, walking, yoga without any issues. Denies any systemic complaints such as fevers, chills, nausea, vomiting. No acute changes since last appointment, and no other complaints at this time.   Objective: AAO x3, NAD DP/PT pulses palpable bilaterally, CRT less than 3 seconds Nails are mildly hypertrophic, dystrophic, discolored yellow to brown discoloration but do appear to be improved. There is no tenderness  or any redness or drainage. There is no significant tenderness to palpation along the surgical site and the right bunion. There is subjective sharp pains on the incision site which is mild and do seem to be better then when we started but she is stillg etting them. The scar is minimal today.  No erythema or increase in warmth and there is no edema. No pain with MTPJ ROM. The scar is very faint except along the distal portion which is minimally hypertrophic.  No open lesions or pre-ulcerative lesions.  No pain with calf compression, swelling, warmth, erythema  Assessment: Onychomycosis; neuritis  Plan: -All treatment options discussed with the patient including all alternatives, risks, complications.  -She wished to proceed with another steroid injection on the incision site. Under sterile conditions a mixture Kenalog localized that was infiltrated around the area of maximal tenderness without  complications directly under the scar. Post injection care was discussed. This does seem to help and symptoms are improving.  -Laserering of Toenails was carried out at today's visit via Q-switch YAG laser by [employer] at continuous on thetoenails.  Patient and staff were wearing appropriate laser protective goggles/eyewear Laser device was tested prior to use and safety protocols were followed Frequency of 5 Hz, Level 4, Joules 1.3 delivered.  The patient tolerated the lasering well without any complications. They were encouraged to call the office with any questions, concerns, change in symptoms. -Follow-up in 1 month or sooner if needed.  -Patient encouraged to call the office with any questions, concerns, change in symptoms.

## 2016-12-03 ENCOUNTER — Ambulatory Visit
Admission: RE | Admit: 2016-12-03 | Discharge: 2016-12-03 | Disposition: A | Discharge status: Home / Self Care | Payer: BLUE CROSS/BLUE SHIELD | Source: Ambulatory Visit | Attending: Internal Medicine | Admitting: Internal Medicine

## 2016-12-03 DIAGNOSIS — M542 Cervicalgia: Secondary | ICD-10-CM

## 2016-12-03 DIAGNOSIS — R42 Dizziness and giddiness: Secondary | ICD-10-CM

## 2016-12-03 DIAGNOSIS — M5412 Radiculopathy, cervical region: Secondary | ICD-10-CM

## 2016-12-29 ENCOUNTER — Ambulatory Visit: Payer: BLUE CROSS/BLUE SHIELD | Admitting: Podiatry

## 2017-01-11 ENCOUNTER — Encounter: Payer: Self-pay | Admitting: Podiatry

## 2017-01-11 ENCOUNTER — Ambulatory Visit (INDEPENDENT_AMBULATORY_CARE_PROVIDER_SITE_OTHER): Payer: BLUE CROSS/BLUE SHIELD | Admitting: Podiatry

## 2017-01-11 DIAGNOSIS — M792 Neuralgia and neuritis, unspecified: Secondary | ICD-10-CM | POA: Diagnosis not present

## 2017-01-11 DIAGNOSIS — B351 Tinea unguium: Secondary | ICD-10-CM | POA: Diagnosis not present

## 2017-01-17 NOTE — Progress Notes (Signed)
Subjective: Patient returns the office for follow-up evaluation of neuritis on the right foot. She states that she just now started tenderness and mild recurrence this is overall much improved compared to what it was even last appointment. She is having no pain to the joint itself or to the surgery site. She is able to do her daily activities, exercise a significant problems. She says her nails are much improved and she has finished laser. She denies any pain in the nails and denies any redness or drainage or any swelling. Denies any systemic complaints such as fevers, chills, nausea, vomiting. No acute changes since last appointment, and no other complaints at this time.   Objective: AAO x3, NAD DP/PT pulses palpable bilaterally, CRT less than 3 seconds Overall the nails are much improved. There is clearing on the nails all of her some mild yellow discoloration mains distally. There is no pain in the toenail there is no surrounding redness or drainage. Scar from the prior surgery and the right bunion is well-healed. There is no pain to the area and there is no numbness or tingling or any nerve symptoms to the area identified today. There is no overlying edema, erythema, increase in warmth. No other areas of tenderness identified bilaterally No open lesions or pre-ulcerative lesions.  No pain with calf compression, swelling, warmth, erythema  Assessment: Improved onychomycosis, resolving neuritis right foot  Plan: -All treatment options discussed with the patient including all alternatives, risks, complications.  -At this point in regards her toenails is doing well. We'll hold off any further laser therapy. Regards to the neuritis symptoms she is currently not expense any discomfort in the injection lasted limit helped quite a bit. Therefore hold off any further injections. Continue with range of motion exercises as well as daily activities as tolerable. I'll see her back as needed at this point. She  agrees to this plan. -Patient encouraged to call the office with any questions, concerns, change in symptoms.   Celesta Gentile, DPM

## 2017-02-06 HISTORY — PX: SPINAL FUSION: SHX223

## 2017-02-06 HISTORY — PX: CERVICAL DISCECTOMY: SHX98

## 2017-02-21 ENCOUNTER — Other Ambulatory Visit: Payer: Self-pay

## 2017-02-21 ENCOUNTER — Ambulatory Visit (INDEPENDENT_AMBULATORY_CARE_PROVIDER_SITE_OTHER): Payer: BLUE CROSS/BLUE SHIELD | Admitting: Obstetrics and Gynecology

## 2017-02-21 ENCOUNTER — Encounter: Payer: Self-pay | Admitting: Obstetrics and Gynecology

## 2017-02-21 VITALS — BP 110/60 | HR 84 | Resp 16 | Ht 65.25 in | Wt 147.0 lb

## 2017-02-21 DIAGNOSIS — N952 Postmenopausal atrophic vaginitis: Secondary | ICD-10-CM | POA: Diagnosis not present

## 2017-02-21 DIAGNOSIS — Z01419 Encounter for gynecological examination (general) (routine) without abnormal findings: Secondary | ICD-10-CM | POA: Diagnosis not present

## 2017-02-21 DIAGNOSIS — N763 Subacute and chronic vulvitis: Secondary | ICD-10-CM | POA: Diagnosis not present

## 2017-02-21 MED ORDER — ESTRADIOL 0.1 MG/GM VA CREA: TOPICAL_CREAM | VAGINAL | 1 refills | Status: AC

## 2017-02-21 NOTE — Patient Instructions (Signed)

## 2017-02-21 NOTE — Progress Notes (Addendum)
66 y.o. [id number] MarriedCaucasianF here for annual exam.   She c/o a 9 month h/o intermittent vulvar irritation. Not itchy, more of a burning. She uses monistat off and on, sometimes helps, sometimes doesn't. Currently she has been having vulvar burning in the last 2 weeks. She also has a hemorrhoid, has noticed a white area near the hemorrhoid.  She has traveled a lot with her husbands job. She was in [place] from 2012 to 2014, stopped HRT when she went there.  Sexually active, no pain with lubrication.  She had a cervical spine discectomy 2 weeks ago.  No vaginal bleeding. Had a hysterectomy many years ago.     No LMP recorded. Patient is postmenopausal.          Sexually active: Yes.    The current method of family planning is post menopausal status.    Exercising: Yes.    walking/ gym Smoker:  Former smoker  Health Maintenance: Pap:  unsure History of abnormal Pap:  No MMG:  07-03-16 DX MMG WNL Colonoscopy:  2011 WNL per patient in [place] BMD:   2017- osteopenia per patient TDaP: unsure Gardasil: N/A   reports that she has quit smoking. Her smoking use included cigarettes. She has a 54.00 pack-year smoking history. she has never used smokeless tobacco. She reports that she drinks about 4.2 oz of alcohol per week. She reports that she does not use drugs. Moved here in May of 2017. Husband works for [employer]. 2 grown children, [place] and [place]. 4 grandchildren.  Past Medical History:  Diagnosis Date  . Anemia   . Cancer ([place])    skin  . Depression   . Endometriosis   . Fibroid   . History of genital warts   . Hypercholesterolemia     Past Surgical History:  Procedure Laterality Date  . ABDOMINAL HYSTERECTOMY    . APPENDECTOMY    . BUNIONECTOMY    . CERVICAL DISCECTOMY  02/06/2017  . COSMETIC SURGERY     liposuction/ laser -face  . ROTATOR CUFF REPAIR Right 2017  . SPINAL FUSION  02/06/2017  . TUBAL LIGATION      Current Outpatient Medications  Medication Sig Dispense  Refill  . citalopram (CELEXA) 20 MG tablet     . fluticasone (FLONASE) 50 MCG/ACT nasal spray     . loratadine (CLARITIN) 10 MG tablet Take by mouth.    Marland Kitchen omeprazole (PRILOSEC) 20 MG capsule Take by mouth.    . rosuvastatin (CRESTOR) 10 MG tablet     . zolpidem (AMBIEN) 10 MG tablet zolpidem 10 mg tablet     No current facility-administered medications for this visit.     Family History  Problem Relation Age of Onset  . Breast cancer Mother   . Hypertension Mother   . Stroke Father   Mom was 24 with her breast cancer (DCIS)  Review of Systems  Constitutional: Negative.   HENT: Negative.   Eyes: Negative.   Respiratory: Negative.   Cardiovascular: Negative.   Gastrointestinal: Negative.   Endocrine: Negative.   Genitourinary:       Vaginal itching and irritation   Musculoskeletal: Negative.   Skin: Negative.   Allergic/Immunologic: Negative.   Neurological: Negative.   Psychiatric/Behavioral: Negative.     Exam:   BP 110/60 (BP Location: Right Arm, Patient Position: Sitting, Cuff Size: Normal)   Pulse 84   Resp 16   Ht 5' 5.25" (1.657 m)   Wt 147 lb (66.7 kg)   BMI  24.27 kg/m   Weight change: @WEIGHTCHANGE @ Height:   Height: 5' 5.25" (165.7 cm)  Ht Readings from Last 3 Encounters:  02/21/17 5' 5.25" (1.657 m)    General appearance: alert, cooperative and appears stated age Head: Normocephalic, without obvious abnormality, atraumatic Neck: no adenopathy, supple, symmetrical, trachea midline and thyroid normal to inspection and palpation Lungs: clear to auscultation bilaterally Cardiovascular: regular rate and rhythm Breasts: normal appearance, no masses or tenderness Abdomen: soft, non-tender; non distended,  no masses,  no organomegaly Extremities: extremities normal, atraumatic, no cyanosis or edema Skin: Skin color, texture, turgor normal. No rashes or lesions Lymph nodes: Cervical, supraclavicular, and axillary nodes normal. No abnormal inguinal nodes  palpated Neurologic: Grossly normal   Pelvic: External genitalia:  no lesions, mild erythema, atrophic, no lesions, plaques or fissures              Urethra:  normal appearing urethra with no masses, tenderness or lesions              Bartholins and Skenes: normal                 Vagina: normal appearing vagina with normal color and discharge, no lesions              Cervix: absent               Bimanual Exam:  Uterus:  uterus absent              Adnexa: no mass, fullness, tenderness               Rectovaginal: Confirms               Anus:  normal sphincter tone, no lesions. 2-3 small (2 mm raised white lesions posterior to the anus)  Chaperone was present for exam.  A:  Well Woman with normal exam  Vulvovaginal atrophy  Vulvitis  Few small white bumps posterior to her anus, not irritated. Will follow  P:   She will bring in a copy of her last DEXA  No pap needed  Mammogram UTD  Colonoscopy UTD  Labs and immunizations with your primary MD  Discussed breast self exam  Discussed calcium and vit D intake   Follow up in one month  Discussed vulvar care reviewed, information given   Addendum: affirm sent, started on vaginal estrogen.

## 2017-02-22 LAB — VAGINITIS/VAGINOSIS, DNA PROBE
Candida Species: NEGATIVE
Gardnerella vaginalis: NEGATIVE
Trichomonas vaginosis: NEGATIVE

## 2017-02-26 ENCOUNTER — Encounter: Payer: BLUE CROSS/BLUE SHIELD | Admitting: Obstetrics and Gynecology

## 2017-02-26 ENCOUNTER — Ambulatory Visit: Payer: BLUE CROSS/BLUE SHIELD | Admitting: Obstetrics and Gynecology

## 2017-03-27 ENCOUNTER — Other Ambulatory Visit: Payer: Self-pay

## 2017-03-27 ENCOUNTER — Encounter: Payer: Self-pay | Admitting: Obstetrics and Gynecology

## 2017-03-27 ENCOUNTER — Ambulatory Visit (INDEPENDENT_AMBULATORY_CARE_PROVIDER_SITE_OTHER): Payer: BLUE CROSS/BLUE SHIELD | Admitting: Obstetrics and Gynecology

## 2017-03-27 VITALS — BP 118/60 | HR 80 | Resp 16 | Wt 148.0 lb

## 2017-03-27 DIAGNOSIS — N952 Postmenopausal atrophic vaginitis: Secondary | ICD-10-CM

## 2017-03-27 DIAGNOSIS — N941 Unspecified dyspareunia: Secondary | ICD-10-CM

## 2017-03-27 NOTE — Progress Notes (Signed)
GYNECOLOGY  VISIT   HPI: 66 y.o.   Married  Caucasian  female   G2P2002 with No LMP recorded. Patient is postmenopausal.   here for follow up of vulvovaginal atrophy and vulvitis. The patient had a negative affirm last month, she was started on vaginal estrogen. Feels so much better, dyspareunia has resolved.  At the end of last week, she started having a stabbing headache on left side of her head for a few days intermittently. Currently resolved. No other symptoms.   She also brought a copy of her DEXA in.   GYNECOLOGIC HISTORY: No LMP recorded. Patient is postmenopausal. Contraception:postmenopause  Menopausal hormone therapy: none         OB History    Gravida Para Term Preterm AB Living   2 2 2     2    SAB TAB Ectopic Multiple Live Births           2         Patient Active Problem List   Diagnosis Date Noted  . HAV (hallux abducto valgus) 12/19/2015  . Tailor's bunion of right foot 12/19/2015  . Onychomycosis 11/08/2015    Past Medical History:  Diagnosis Date  . Anemia   . Cancer ([place])    skin  . Depression   . Endometriosis   . Fibroid   . History of genital warts   . Hypercholesterolemia     Past Surgical History:  Procedure Laterality Date  . ABDOMINAL HYSTERECTOMY    . APPENDECTOMY    . BUNIONECTOMY    . CERVICAL DISCECTOMY  02/06/2017  . COSMETIC SURGERY     liposuction/ laser -face  . ROTATOR CUFF REPAIR Right 2017  . SPINAL FUSION  02/06/2017  . TUBAL LIGATION      Current Outpatient Medications  Medication Sig Dispense Refill  . Calcium-Magnesium-Vitamin D (CALCIUM 500 PO) Take by mouth.    . citalopram (CELEXA) 20 MG tablet     . estradiol (ESTRACE) 0.1 MG/GM vaginal cream 1 gram vaginally every night for one week, then one gram vaginally twice weekly 42.5 g 1  . fluticasone (FLONASE) 50 MCG/ACT nasal spray     . loratadine (CLARITIN) 10 MG tablet Take by mouth.    Marland Kitchen omeprazole (PRILOSEC) 20 MG capsule Take by mouth.    . rosuvastatin  (CRESTOR) 10 MG tablet     . zolpidem (AMBIEN) 10 MG tablet zolpidem 10 mg tablet     No current facility-administered medications for this visit.      ALLERGIES: Adhesive [tape]; Codeine; and Latex  Family History  Problem Relation Age of Onset  . Breast cancer Mother   . Hypertension Mother   . Stroke Father     Social History   Socioeconomic History  . Marital status: Married    Spouse name: Not on file  . Number of children: Not on file  . Years of education: Not on file  . Highest education level: Not on file  Social Needs  . Financial resource strain: Not on file  . Food insecurity - worry: Not on file  . Food insecurity - inability: Not on file  . Transportation needs - medical: Not on file  . Transportation needs - non-medical: Not on file  Occupational History  . Not on file  Tobacco Use  . Smoking status: Former Smoker    Packs/day: 2.00    Years: 27.00    Pack years: 54.00    Types: Cigarettes  . Smokeless tobacco:  Never Used  Substance and Sexual Activity  . Alcohol use: Yes    Alcohol/week: 4.2 oz    Types: 7 Glasses of wine per week  . Drug use: No  . Sexual activity: Yes    Partners: Male    Birth control/protection: Post-menopausal  Other Topics Concern  . Not on file  Social History Narrative  . Not on file    Review of Systems  Constitutional: Negative.   HENT: Negative.   Eyes: Negative.   Respiratory: Negative.   Cardiovascular: Negative.   Gastrointestinal: Negative.   Genitourinary: Negative.   Musculoskeletal: Negative.   Skin: Negative.   Neurological: Positive for headaches.  Endo/Heme/Allergies: Negative.   Psychiatric/Behavioral: Positive for depression.    PHYSICAL EXAMINATION:    BP 118/60 (BP Location: Right Arm, Patient Position: Sitting, Cuff Size: Normal)   Pulse 80   Resp 16   Wt 148 lb (67.1 kg)   BMI 24.44 kg/m     General appearance: alert, cooperative and appears stated age  Pelvic: External genitalia:   no lesions              Urethra:  normal appearing urethra with no masses, tenderness or lesions              Bartholins and Skenes: normal                 Vagina: normal appearing vagina with normal color and discharge, no lesions. Atrophy is markedly improved. Able to comfortably insert 2 fingers into the vagina.               Cervix:absent Chaperone was present for exam.  ASSESSMENT Vaginal atrophy, markedly improved with vaginal estrogen DEXA reviewed, normal (lowest T score was in her lumbar spine and was -1.0)  PLAN Continue vaginal estrogen.   An After Visit Summary was printed and given to the patient.

## 2017-03-28 ENCOUNTER — Encounter: Payer: Self-pay | Admitting: Obstetrics and Gynecology

## 2017-07-04 ENCOUNTER — Other Ambulatory Visit: Payer: Self-pay | Admitting: Internal Medicine

## 2017-07-04 DIAGNOSIS — Z1231 Encounter for screening mammogram for malignant neoplasm of breast: Secondary | ICD-10-CM

## 2017-07-20 ENCOUNTER — Ambulatory Visit
Admission: RE | Admit: 2017-07-20 | Discharge: 2017-07-20 | Disposition: A | Discharge status: Home / Self Care | Payer: BLUE CROSS/BLUE SHIELD | Source: Ambulatory Visit | Attending: Internal Medicine | Admitting: Internal Medicine

## 2017-07-20 DIAGNOSIS — Z1231 Encounter for screening mammogram for malignant neoplasm of breast: Secondary | ICD-10-CM

## 2018-05-11 HISTORY — PX: REPLACEMENT TOTAL KNEE: SUR1224

## 2018-08-30 ENCOUNTER — Other Ambulatory Visit: Payer: Self-pay | Admitting: Internal Medicine

## 2018-08-30 DIAGNOSIS — Z1231 Encounter for screening mammogram for malignant neoplasm of breast: Secondary | ICD-10-CM

## 2018-10-21 ENCOUNTER — Ambulatory Visit
Admission: RE | Admit: 2018-10-21 | Discharge: 2018-10-21 | Disposition: A | Discharge status: Home / Self Care | Payer: BC Managed Care – PPO | Source: Ambulatory Visit | Attending: Internal Medicine | Admitting: Internal Medicine

## 2018-10-21 DIAGNOSIS — Z1231 Encounter for screening mammogram for malignant neoplasm of breast: Secondary | ICD-10-CM

## 2019-05-26 ENCOUNTER — Other Ambulatory Visit: Payer: Self-pay | Admitting: Orthopaedic Surgery

## 2019-05-26 ENCOUNTER — Other Ambulatory Visit (HOSPITAL_COMMUNITY): Payer: Self-pay | Admitting: Orthopaedic Surgery

## 2019-05-26 DIAGNOSIS — T8484XA Pain due to internal orthopedic prosthetic devices, implants and grafts, initial encounter: Secondary | ICD-10-CM

## 2019-06-05 ENCOUNTER — Encounter (HOSPITAL_COMMUNITY)
Admission: RE | Admit: 2019-06-05 | Discharge: 2019-06-05 | Disposition: A | Discharge status: Home / Self Care | Payer: BC Managed Care – PPO | Source: Ambulatory Visit | Attending: Orthopaedic Surgery | Admitting: Orthopaedic Surgery

## 2019-06-05 ENCOUNTER — Other Ambulatory Visit: Payer: Self-pay

## 2019-06-05 ENCOUNTER — Ambulatory Visit (HOSPITAL_COMMUNITY)
Admission: RE | Admit: 2019-06-05 | Discharge: 2019-06-05 | Disposition: A | Discharge status: Home / Self Care | Payer: BC Managed Care – PPO | Source: Ambulatory Visit | Attending: Orthopaedic Surgery | Admitting: Orthopaedic Surgery

## 2019-06-05 DIAGNOSIS — Z96651 Presence of right artificial knee joint: Secondary | ICD-10-CM | POA: Insufficient documentation

## 2019-06-05 DIAGNOSIS — T8484XA Pain due to internal orthopedic prosthetic devices, implants and grafts, initial encounter: Secondary | ICD-10-CM | POA: Insufficient documentation

## 2019-06-05 MED ORDER — TECHNETIUM TC 99M MEDRONATE IV KIT
20.0000 | PACK | Freq: Once | INTRAVENOUS | Status: AC | PRN
  Administered 2019-06-05: 20 via INTRAVENOUS

## 2019-07-31 ENCOUNTER — Other Ambulatory Visit: Payer: Self-pay

## 2019-07-31 ENCOUNTER — Ambulatory Visit (INDEPENDENT_AMBULATORY_CARE_PROVIDER_SITE_OTHER): Payer: BC Managed Care – PPO

## 2019-07-31 ENCOUNTER — Encounter: Payer: Self-pay | Admitting: Podiatry

## 2019-07-31 ENCOUNTER — Ambulatory Visit (INDEPENDENT_AMBULATORY_CARE_PROVIDER_SITE_OTHER): Payer: BC Managed Care – PPO | Admitting: Podiatry

## 2019-07-31 VITALS — BP 115/71 | HR 74 | Resp 14

## 2019-07-31 DIAGNOSIS — M7989 Other specified soft tissue disorders: Secondary | ICD-10-CM

## 2019-07-31 DIAGNOSIS — M779 Enthesopathy, unspecified: Secondary | ICD-10-CM

## 2019-07-31 DIAGNOSIS — B351 Tinea unguium: Secondary | ICD-10-CM

## 2019-07-31 DIAGNOSIS — M792 Neuralgia and neuritis, unspecified: Secondary | ICD-10-CM

## 2019-07-31 DIAGNOSIS — M898X9 Other specified disorders of bone, unspecified site: Secondary | ICD-10-CM

## 2019-07-31 NOTE — Progress Notes (Signed)
Subjective:   Patient ID: Sheila Hawkins, female   DOB: 69 y.o.   MRN: [id number]   HPI 69 year old female presents the office with concerns of bilateral foot pain on the left side worse than the right.  She points on the medial aspect of the foot along the area of a "knot" when she does discomfort.  She also describes a sharp pain to this area as well as on the medial big toe joints.  This is also causing burning and mostly at nighttime.  She concern about possible gout.  She denies any swelling or redness.  Also has not nail fungus on the right big toe.  She said no recent treatment for these issues.  She has no other concerns.  She is an avid walker walking 5 miles a day.   Review of Systems  All other systems reviewed and are negative.  Past Medical History:  Diagnosis Date  . Anemia   . Cancer ([place])    skin  . Depression   . Endometriosis   . Fibroid   . History of genital warts   . Hypercholesterolemia     Past Surgical History:  Procedure Laterality Date  . ABDOMINAL HYSTERECTOMY    . APPENDECTOMY    . BREAST EXCISIONAL BIOPSY Left pt unsure   over 10 years ago; benign  . BUNIONECTOMY    . CERVICAL DISCECTOMY  02/06/2017  . COSMETIC SURGERY     liposuction/ laser -face  . ROTATOR CUFF REPAIR Right 2017  . SPINAL FUSION  02/06/2017  . TUBAL LIGATION       Current Outpatient Medications:  Marland Kitchen  Multiple Vitamin (MULTIVITAMIN) capsule, Take 1 capsule by mouth daily. zinc, Disp: , Rfl:  .  citalopram (CELEXA) 20 MG tablet, , Disp: , Rfl:  .  estradiol (ESTRACE) 0.1 MG/GM vaginal cream, 1 gram vaginally every night for one week, then one gram vaginally twice weekly, Disp: 42.5 g, Rfl: 1 .  fluticasone (FLONASE) 50 MCG/ACT nasal spray, , Disp: , Rfl:  .  loratadine (CLARITIN) 10 MG tablet, Take by mouth., Disp: , Rfl:  .  omeprazole (PRILOSEC) 20 MG capsule, Take by mouth., Disp: , Rfl:  .  rosuvastatin (CRESTOR) 10 MG tablet, , Disp: , Rfl:  .  zolpidem (AMBIEN) 10 MG  tablet, zolpidem 10 mg tablet, Disp: , Rfl:   Allergies  Allergen Reactions  . Adhesive [Tape]   . Codeine   . Latex Rash         Objective:  Physical Exam  General: AAO x3, NAD  Dermatological: Right hallux toenails mildly hypertrophic, dystrophic with yellow discoloration.  No pain, redness or drainage or signs of infection.  Vascular: Dorsalis Pedis artery and Posterior Tibial artery pedal pulses are 2/4 bilateral with immedate capillary fill time. Pedal hair growth present. No varicosities and no lower extremity edema present bilateral. There is no pain with calf compression, swelling, warmth, erythema.   Neruologic: Grossly intact via light touch bilateral.   Musculoskeletal: On the medial aspect the foot along the medial aspect of the medial cuneiform there is a bony prominence present in the almost a small fluid-filled mass is present this is a ganglion cyst versus bursa.  There is no erythema, edema otherwise.  No pain on the course of flexor, extensor tendons.  No significant discomfort on the medial aspect of first metatarsal heads but she describing burning sensation at times.  No current symptoms.  Muscular strength 5/5 in all groups tested bilateral.  Gait:  Unassisted, Nonantalgic.       Assessment:   68 year old female with exostosis, soft tissue mass bilateral medial midfoot left side worse than right; neuritis; onychomycosis  Plan:  -Treatment options discussed including all alternatives, risks, and complications -Etiology of symptoms were discussed -X-rays were obtained and reviewed with the patient.  Skin marker is utilized to identify the area of discomfort.  -Steroid injection performed for the left foot.  Skin was cleaned with alcohol and a mixture of half cc dexamethasone phosphate, half cc Marcaine plain was infiltrated into the area maximal tenderness without complications.  Postinjection care discussed.  Dispensed offloading pads.  I discussed Voltaren  gel.  Dispensed formula 7 for the toenail fungus.  Trula Slade DPM

## 2019-08-01 ENCOUNTER — Other Ambulatory Visit: Payer: Self-pay | Admitting: Podiatry

## 2019-08-01 DIAGNOSIS — M7989 Other specified soft tissue disorders: Secondary | ICD-10-CM

## 2019-08-06 ENCOUNTER — Encounter: Payer: Self-pay | Admitting: Gastroenterology

## 2019-08-25 ENCOUNTER — Other Ambulatory Visit: Payer: Self-pay

## 2019-08-25 ENCOUNTER — Ambulatory Visit: Payer: BC Managed Care – PPO | Admitting: Orthotics

## 2019-08-25 DIAGNOSIS — M898X9 Other specified disorders of bone, unspecified site: Secondary | ICD-10-CM

## 2019-08-25 NOTE — Progress Notes (Signed)
Add ppt cushion to f/o to help take pressure off bony medial prominence.  If this fails to achieve desired outcome, she will be cast for new f/o.

## 2019-09-09 ENCOUNTER — Ambulatory Visit (AMBULATORY_SURGERY_CENTER): Payer: Self-pay | Admitting: *Deleted

## 2019-09-09 ENCOUNTER — Other Ambulatory Visit: Payer: Self-pay

## 2019-09-09 VITALS — Ht 65.25 in | Wt 139.0 lb

## 2019-09-09 DIAGNOSIS — Z1211 Encounter for screening for malignant neoplasm of colon: Secondary | ICD-10-CM

## 2019-09-09 MED ORDER — SUTAB 1479-225-188 MG PO TABS: 1.0000 | ORAL_TABLET | ORAL | 0 refills | Status: DC

## 2019-09-09 NOTE — Progress Notes (Signed)
Patient is here in-person for PV. Patient denies any allergies to eggs or soy. Patient denies any problems with anesthesia/sedation. Patient denies any oxygen use at home. Patient denies taking any diet/weight loss medications or blood thinners. Patient is not being treated for MRSA or C-diff. Patient is aware of our care-partner policy and 0000000 safety protocol. EMMI education assisgned to the patient for the procedure, this was explained and instructions given to patient.  COMPLETED COVID VACCINES X2 ON 06/15/19. Prep Prescription coupon given to the patient.

## 2019-09-11 ENCOUNTER — Ambulatory Visit: Payer: BC Managed Care – PPO | Admitting: Podiatry

## 2019-09-12 ENCOUNTER — Encounter: Payer: Self-pay | Admitting: Gastroenterology

## 2019-09-12 ENCOUNTER — Other Ambulatory Visit: Payer: Self-pay | Admitting: Internal Medicine

## 2019-09-12 DIAGNOSIS — Z1231 Encounter for screening mammogram for malignant neoplasm of breast: Secondary | ICD-10-CM

## 2019-09-23 ENCOUNTER — Encounter: Payer: Self-pay | Admitting: Gastroenterology

## 2019-09-23 ENCOUNTER — Other Ambulatory Visit: Payer: Self-pay

## 2019-09-23 ENCOUNTER — Ambulatory Visit (AMBULATORY_SURGERY_CENTER): Payer: BC Managed Care – PPO | Admitting: Gastroenterology

## 2019-09-23 VITALS — BP 135/51 | HR 59 | Temp 98.2°F | Resp 16 | Ht 65.0 in | Wt 139.0 lb

## 2019-09-23 DIAGNOSIS — Z1211 Encounter for screening for malignant neoplasm of colon: Secondary | ICD-10-CM

## 2019-09-23 MED ORDER — SODIUM CHLORIDE 0.9 % IV SOLN: 500.0000 mL | Freq: Once | INTRAVENOUS | Status: DC

## 2019-09-23 NOTE — Op Note (Signed)
[facility] Patient Name: Sheila Hawkins Procedure Date: 09/23/2019 8:12 AM MRN: [id number] Endoscopist: Thornton Park MD, MD Age: 69 Referring MD:  Date of Birth: Aug 17, 1950 Gender: Female Account #: [id number] Procedure:                Colonoscopy Indications:              Screening for colorectal malignant neoplasm                           Colonoscopy in [place] in [place] revealed                            diverticulosis                           Colonoscopy in [place] between 2008-2011 was                            normal                           No known family history of colon cancer or polyps Medicines:                Monitored Anesthesia Care Procedure:                Pre-Anesthesia Assessment:                           - Prior to the procedure, a History and Physical                            was performed, and patient medications and                            allergies were reviewed. The patient's tolerance of                            previous anesthesia was also reviewed. The risks                            and benefits of the procedure and the sedation                            options and risks were discussed with the patient.                            All questions were answered, and informed consent                            was obtained. Prior Anticoagulants: The patient has                            taken no previous anticoagulant or antiplatelet                            agents. ASA Grade Assessment:  II - A patient with                            mild systemic disease. After reviewing the risks                            and benefits, the patient was deemed in                            satisfactory condition to undergo the procedure.                           After obtaining informed consent, the colonoscope                            was passed under direct vision. Throughout the                            procedure, the patient's  blood pressure, pulse, and                            oxygen saturations were monitored continuously. The                            Colonoscope was introduced through the anus and                            advanced to the 4 cm into the ileum. A second                            forward view of the right colon was performed. The                            colonoscopy was performed without difficulty. The                            patient tolerated the procedure well. The quality                            of the bowel preparation was good. Scope In: 8:23:28 AM Scope Out: 8:38:03 AM Scope Withdrawal Time: 0 hours 8 minutes 35 seconds  Total Procedure Duration: 0 hours 14 minutes 35 seconds  Findings:                 A few small-mouthed diverticula were found in the                            sigmoid colon.                           The exam was otherwise without abnormality on                            direct and retroflexion views except for small  hemorrhoids. Complications:            No immediate complications. Estimated Blood Loss:     Estimated blood loss: none. Impression:               - Diverticulosis in the sigmoid colon.                           - The examination was otherwise normal on direct                            and retroflexion views.                           - No specimens collected. Recommendation:           - Patient has a contact number available for                            emergencies. The signs and symptoms of potential                            delayed complications were discussed with the                            patient. Return to normal activities tomorrow.                            Written discharge instructions were provided to the                            patient.                           - Follow a high fiber diet. Drink at least 64                            ounces of water daily. Add a daily stool bulking                             agent such as psyllium (an exampled would be                            Metamucil).                           - Continue present medications.                           - Repeat colonoscopy in 10 years for screening                            purposes, earlier with new symptoms.                           - Emerging evidence supports eating a diet of  fruits, vegetables, grains, calcium, and yogurt                            while reducing red meat and alcohol may reduce the                            risk of colon cancer.                           - Thank you for allowing me to be involved in your                            colon cancer prevention. Thornton Park MD, MD 09/23/2019 8:44:46 AM This report has been signed electronically.

## 2019-09-23 NOTE — Progress Notes (Signed)
Pt's states no medical or surgical changes since previsit or office visit. 

## 2019-09-23 NOTE — Progress Notes (Signed)
Report to PACU, RN, vss, BBS= Clear.  

## 2019-09-23 NOTE — Patient Instructions (Signed)
Please read handouts provided. Follow high fiber diet. Continue present medications. Add a daily stool bulking agent.     YOU HAD AN ENDOSCOPIC PROCEDURE TODAY AT [facility]:   Refer to the procedure report that was given to you for any specific questions about what was found during the examination.  If the procedure report does not answer your questions, please call your gastroenterologist to clarify.  If you requested that your care partner not be given the details of your procedure findings, then the procedure report has been included in a sealed envelope for you to review at your convenience later.  YOU SHOULD EXPECT: Some feelings of bloating in the abdomen. Passage of more gas than usual.  Walking can help get rid of the air that was put into your GI tract during the procedure and reduce the bloating. If you had a lower endoscopy (such as a colonoscopy or flexible sigmoidoscopy) you may notice spotting of blood in your stool or on the toilet paper. If you underwent a bowel prep for your procedure, you may not have a normal bowel movement for a few days.  Please Note:  You might notice some irritation and congestion in your nose or some drainage.  This is from the oxygen used during your procedure.  There is no need for concern and it should clear up in a day or so.  SYMPTOMS TO REPORT IMMEDIATELY:   Following lower endoscopy (colonoscopy or flexible sigmoidoscopy):  Excessive amounts of blood in the stool  Significant tenderness or worsening of abdominal pains  Swelling of the abdomen that is new, acute  Fever of 100F or higher   For urgent or emergent issues, a gastroenterologist can be reached at any hour by calling [phone]. Do not use MyChart messaging for urgent concerns.    DIET:  We do recommend a small meal at first, but then you may proceed to your regular diet.  Drink plenty of fluids but you should avoid alcoholic beverages for 24  hours.  ACTIVITY:  You should plan to take it easy for the rest of today and you should NOT DRIVE or use heavy machinery until tomorrow (because of the sedation medicines used during the test).    FOLLOW UP: Our staff will call the number listed on your records 48-72 hours following your procedure to check on you and address any questions or concerns that you may have regarding the information given to you following your procedure. If we do not reach you, we will leave a message.  We will attempt to reach you two times.  During this call, we will ask if you have developed any symptoms of COVID 19. If you develop any symptoms (ie: fever, flu-like symptoms, shortness of breath, cough etc.) before then, please call [phone].  If you test positive for Covid 19 in the 2 weeks post procedure, please call and report this information to [place].    If any biopsies were taken you will be contacted by phone or by letter within the next 1-3 weeks.  Please call us at [phone] if you have not heard about the biopsies in 3 weeks.    SIGNATURES/CONFIDENTIALITY: You and/or your care partner have signed paperwork which will be entered into your electronic medical record.  These signatures attest to the fact that that the information above on your After Visit Summary has been reviewed and is understood.  Full responsibility of the confidentiality of this discharge information lies with you  and/or your care-partner. 

## 2019-09-25 ENCOUNTER — Telehealth: Payer: Self-pay | Admitting: *Deleted

## 2019-09-25 NOTE — Telephone Encounter (Signed)
  Follow up Call-  Call back number 09/23/2019  Post procedure Call Back phone  # [phone]  Permission to leave phone message Yes  Some recent data might be hidden     Patient questions:  Do you have a fever, pain , or abdominal swelling? No. Pain Score  0 *  Have you tolerated food without any problems? Yes.    Have you been able to return to your normal activities? Yes.    Do you have any questions about your discharge instructions: Diet   No. Medications  No. Follow up visit  No.  Do you have questions or concerns about your Care? No.  Actions: * If pain score is 4 or above: No action needed, pain <4.  1. Have you developed a fever since your procedure? no  2.   Have you had an respiratory symptoms (SOB or cough) since your procedure? no  3.   Have you tested positive for COVID 19 since your procedure no  4.   Have you had any family members/close contacts diagnosed with the COVID 19 since your procedure?  no   If yes to any of these questions please route to Joylene John, RN and Erenest Rasher, RN

## 2019-10-28 ENCOUNTER — Ambulatory Visit
Admission: RE | Admit: 2019-10-28 | Discharge: 2019-10-28 | Disposition: A | Discharge status: Home / Self Care | Payer: BC Managed Care – PPO | Source: Ambulatory Visit

## 2019-10-28 ENCOUNTER — Other Ambulatory Visit: Payer: Self-pay

## 2019-10-28 DIAGNOSIS — Z1231 Encounter for screening mammogram for malignant neoplasm of breast: Secondary | ICD-10-CM

## 2022-02-17 IMAGING — MG DIGITAL SCREENING BILAT W/ TOMO W/ CAD
6 of 10 series · 6 of 30 positions shown · non-contrast
Comparison: Previous exam(s).

CLINICAL DATA: Screening.

EXAM:
DIGITAL SCREENING BILATERAL MAMMOGRAM WITH TOMO AND CAD

[L CC synth-2D]
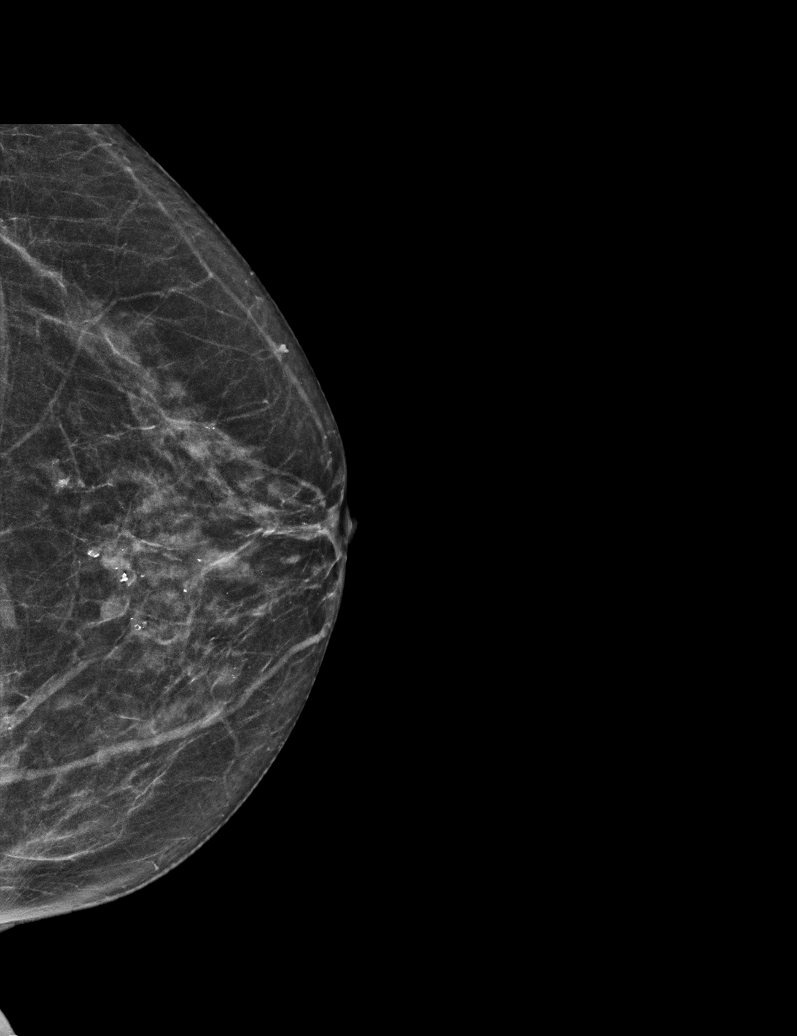

[R CC synth-2D (1 of 2)]
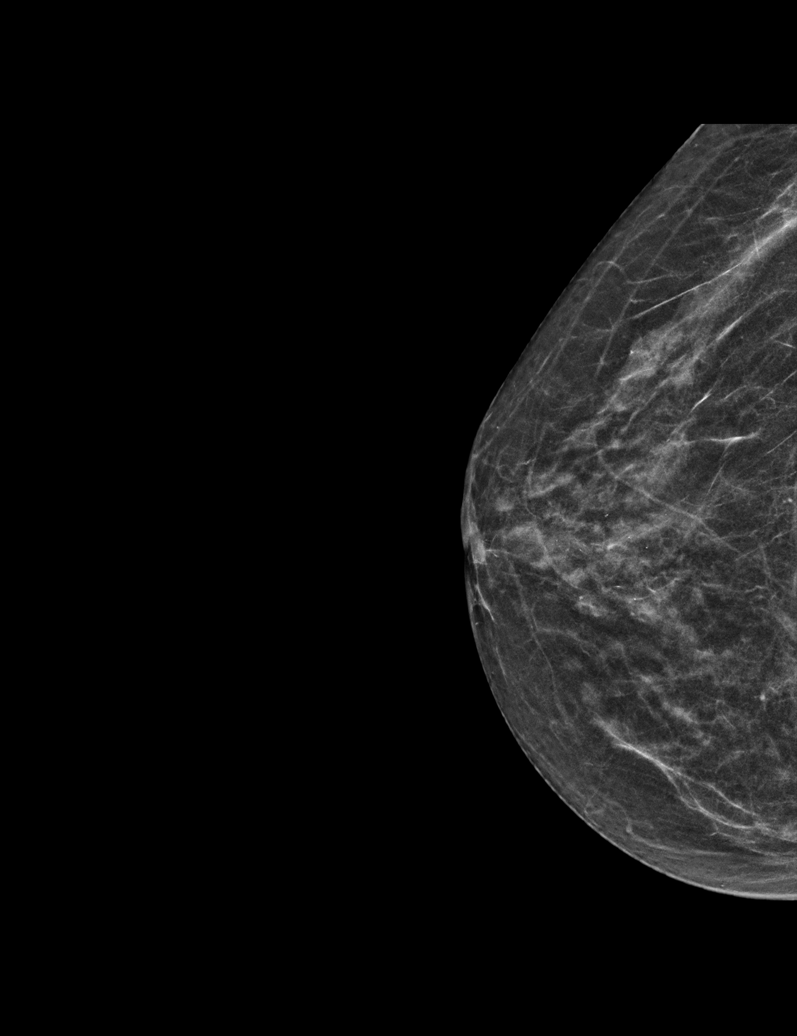

[L MLO synth-2D]
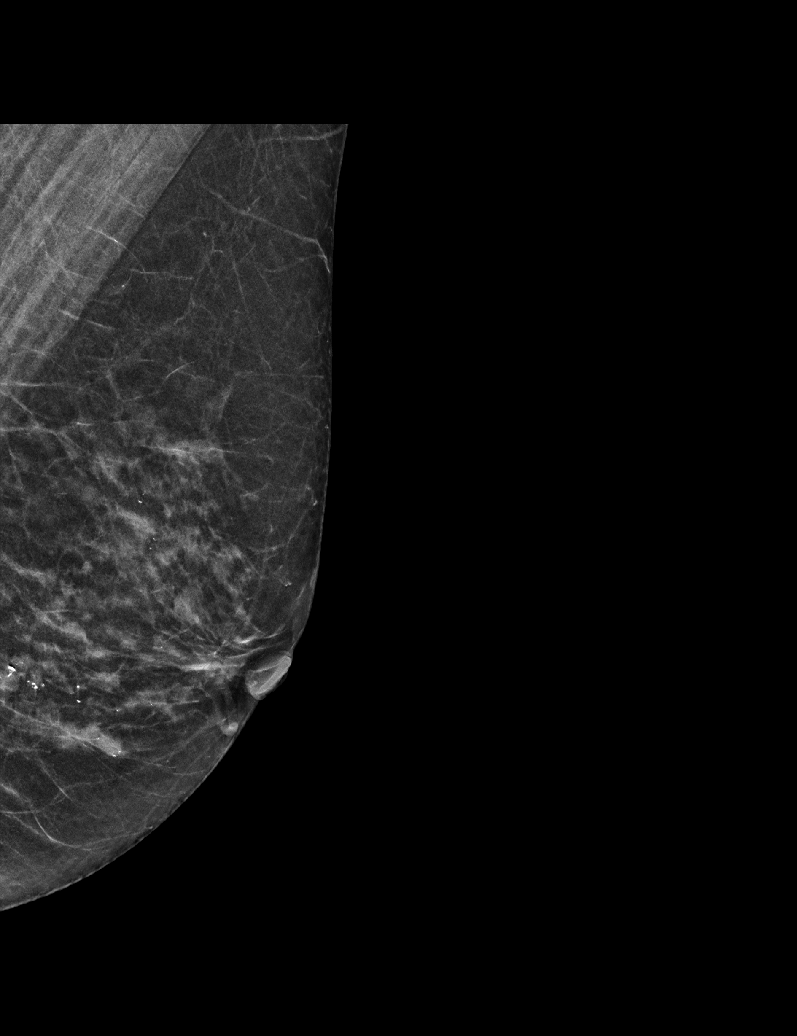

[R CC synth-2D (2 of 2)]
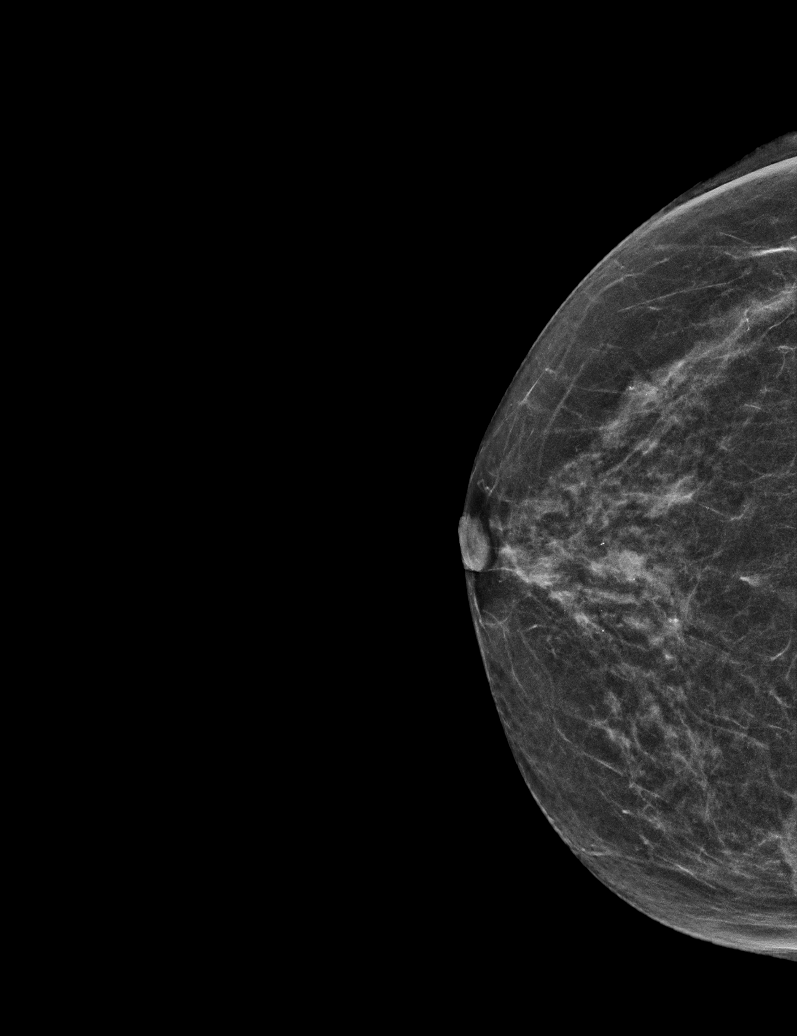

[R MLO synth-2D]
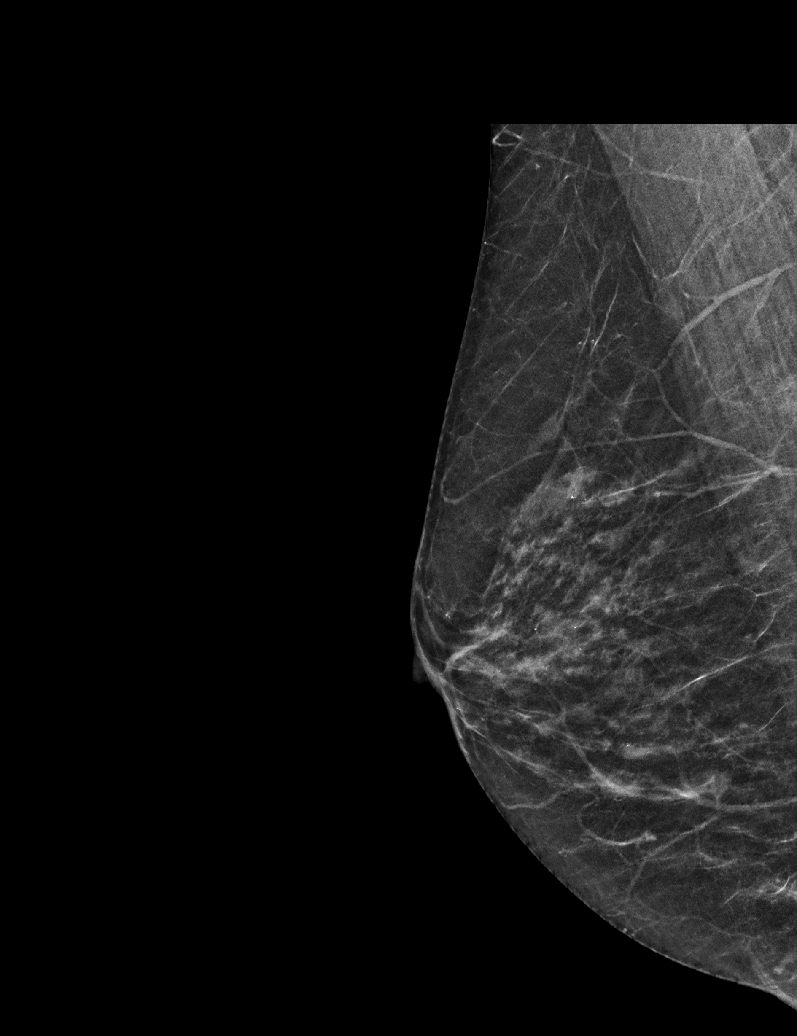

[L CC tomo · tomo slice 25/48.0]
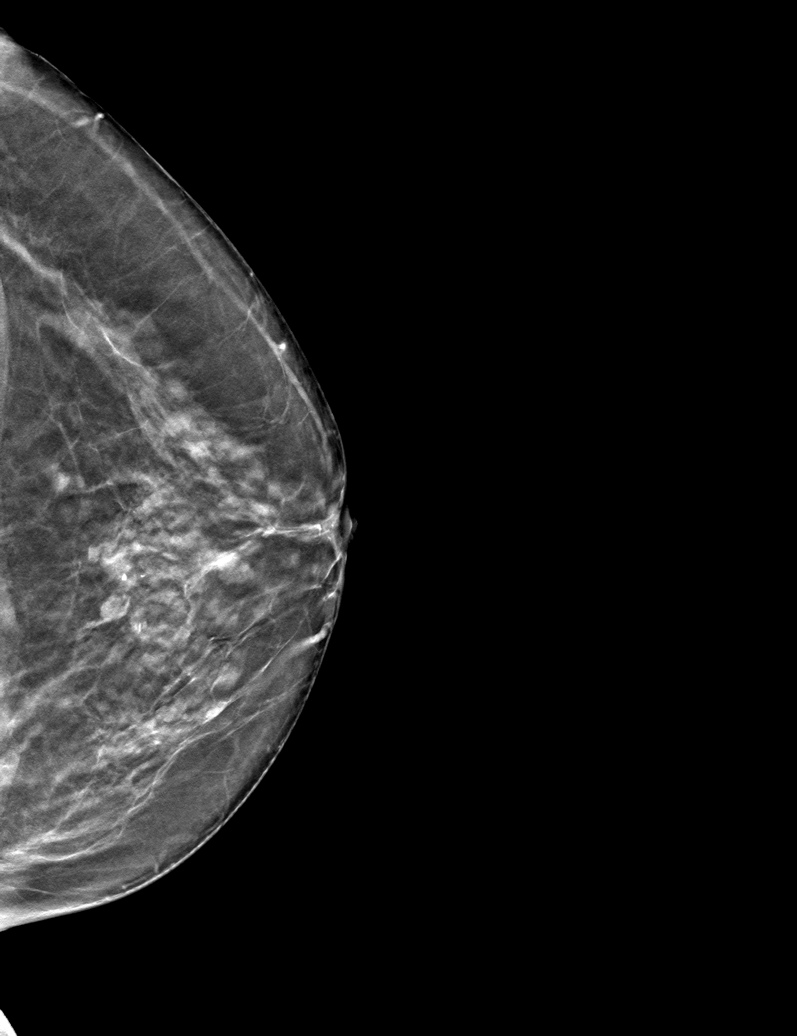

[6 of 30 positions shown; findings below may reference images not displayed]

ACR Breast Density Category c: The breast tissue is heterogeneously
dense, which may obscure small masses.
FINDINGS: There are no findings suspicious for malignancy. Images were
processed with CAD.
IMPRESSION: No mammographic evidence of malignancy. A result letter of this
screening mammogram will be mailed directly to the patient.

RECOMMENDATION:
Screening mammogram in one year. (Code:[id number])

BI-RADS CATEGORY  1: Negative.
